# Patient Record
Sex: Female | Born: 1985 | Race: White | Hispanic: No | Smoking: Former smoker
Health system: Southern US, Community
[De-identification: ages and names within clinical notes are randomized; demographics above are authoritative.]

## PROBLEM LIST (undated history)

## (undated) DIAGNOSIS — K219 Gastro-esophageal reflux disease without esophagitis: Secondary | ICD-10-CM

## (undated) DIAGNOSIS — F419 Anxiety disorder, unspecified: Secondary | ICD-10-CM

## (undated) DIAGNOSIS — Z87898 Personal history of other specified conditions: Secondary | ICD-10-CM

## (undated) DIAGNOSIS — G90A Postural orthostatic tachycardia syndrome (POTS): Secondary | ICD-10-CM

## (undated) DIAGNOSIS — E079 Disorder of thyroid, unspecified: Secondary | ICD-10-CM

## (undated) DIAGNOSIS — T7840XA Allergy, unspecified, initial encounter: Secondary | ICD-10-CM

## (undated) DIAGNOSIS — IMO0002 Reserved for concepts with insufficient information to code with codable children: Secondary | ICD-10-CM

## (undated) DIAGNOSIS — Z9889 Other specified postprocedural states: Secondary | ICD-10-CM

## (undated) DIAGNOSIS — I498 Other specified cardiac arrhythmias: Secondary | ICD-10-CM

## (undated) DIAGNOSIS — R112 Nausea with vomiting, unspecified: Secondary | ICD-10-CM

## (undated) HISTORY — DX: Disorder of thyroid, unspecified: E07.9

## (undated) HISTORY — DX: Allergy, unspecified, initial encounter: T78.40XA

## (undated) HISTORY — PX: DENTAL SURGERY: SHX609

## (undated) HISTORY — PX: SPINE SURGERY: SHX786

## (undated) HISTORY — DX: Anxiety disorder, unspecified: F41.9

## (undated) HISTORY — PX: BRAIN SURGERY: SHX531

---

## 2007-05-23 ENCOUNTER — Other Ambulatory Visit: Admission: RE | Admit: 2007-05-23 | Discharge: 2007-05-23 | Payer: Self-pay | Admitting: Obstetrics & Gynecology

## 2007-11-28 ENCOUNTER — Inpatient Hospital Stay (HOSPITAL_COMMUNITY): Admission: AD | Admit: 2007-11-28 | Discharge: 2007-11-29 | Payer: Self-pay | Admitting: Obstetrics and Gynecology

## 2009-01-19 ENCOUNTER — Emergency Department (HOSPITAL_COMMUNITY): Admission: EM | Admit: 2009-01-19 | Discharge: 2009-01-19 | Payer: Self-pay | Admitting: Emergency Medicine

## 2010-04-07 ENCOUNTER — Ambulatory Visit (HOSPITAL_COMMUNITY): Admission: RE | Admit: 2010-04-07 | Discharge: 2010-04-07 | Payer: Self-pay | Admitting: Internal Medicine

## 2010-12-15 NOTE — Discharge Summary (Signed)
Melanie Thomas, Melanie Thomas            ACCOUNT NO.:  [id number]   MEDICAL RECORD NO.:  [id number]          PATIENT TYPE:  INP   LOCATION:  9135                          FACILITY:  [facility]   PHYSICIAN:  Huel Cote, M.D. DATE OF BIRTH:  1986/02/26   DATE OF ADMISSION:  11/28/2007  DATE OF DISCHARGE:  11/29/2007                               DISCHARGE SUMMARY   DISCHARGE DIAGNOSES:  1. Term pregnancy at 39 weeks, delivered.  2. Status post normal spontaneous vaginal delivery, VBAC.   DISCHARGE MEDICATIONS:  Motrin 600 mg p.o. every 6 hours.   DISCHARGE FOLLOWUP:  The patient is to follow up in my office in  approximately 6 weeks for her full postpartum exam.   HOSPITAL COURSE:  The patient is 25 year old G2, P 1-0-0-1 who was  admitted in early labor with spontaneous rupture of membranes at 14  weeks' gestation.  Prenatal care was significant for a prior C-section,  and the patient definitely desired to have a VBAC delivery.  She had  been counseled with all risks and benefits and decided to have a trial  of labor.  Otherwise, her prenatal care was really uneventful.   Her prenatal labs were as follows:  A positive, antibody negative, RPR  nonreactive, rubella immune, hepatitis B surface antigen negative, HIV  negative, GC negative, chlamydia negative, group B strep negative, 1-  hour Glucola 141, and 3-hour Glucola within normal limits.  Her prenatal  course had been uneventful except for the C-section performed for fetal  distress in her prior pregnancy.   PAST OBSTETRICAL HISTORY:  The C-section was in July 2006, 7 pounds 10  ounce infant.   PAST MEDICAL HISTORY:  None.   PAST SURGICAL HISTORY:  C-section only.   PAST GYN HISTORY:  None.   On admission, she was afebrile with stable vital signs.  Fetal heart  rate was reactive.  Cervix was 1-2, 70, and -2 station, and she had  grossly ruptured membranes documented.  She received an epidural after  being placed on Pitocin for  augmentation of labor and did make progress,  progressed into complete dilation by the evening.  She then pushed great  with a normal spontaneous vaginal delivery of a vigorous female infant  over a small first-degree laceration.  Apgars were 9 and 9.  Weight was  7 pounds 7 ounces.  Placenta delivered spontaneously.  Uterine scar was  palpated and intact.  Estimated blood loss was 350 mL.  She had one  figure-of-eight suture placed in her  laceration for hemostasis and was then admitted for routine postpartum  care.  Postpartum day #1, hemoglobin was 11 and she was doing quite  well.  She actually requested early discharge, and this was granted as  the baby was able to be discharged also.  She is to follow up in the  office in 6 weeks for her routine postpartum exam.      Huel Cote, M.D.  Electronically Signed     KR/MEDQ  D:  12/17/2007  T:  12/17/2007  Job:  [id number]

## 2012-05-14 ENCOUNTER — Encounter (HOSPITAL_COMMUNITY): Payer: Self-pay | Admitting: *Deleted

## 2012-05-14 ENCOUNTER — Emergency Department (HOSPITAL_COMMUNITY)
Admission: EM | Admit: 2012-05-14 | Discharge: 2012-05-14 | Disposition: A | Discharge status: Home / Self Care | Payer: Self-pay | Attending: Emergency Medicine | Admitting: Emergency Medicine

## 2012-05-14 DIAGNOSIS — N342 Other urethritis: Secondary | ICD-10-CM | POA: Insufficient documentation

## 2012-05-14 DIAGNOSIS — Z87891 Personal history of nicotine dependence: Secondary | ICD-10-CM | POA: Insufficient documentation

## 2012-05-14 LAB — WET PREP, GENITAL: Yeast Wet Prep HPF POC: NONE SEEN

## 2012-05-14 LAB — PREGNANCY, URINE: Preg Test, Ur: NEGATIVE

## 2012-05-14 LAB — URINALYSIS, ROUTINE W REFLEX MICROSCOPIC
Glucose, UA: NEGATIVE mg/dL
Leukocytes, UA: NEGATIVE
Specific Gravity, Urine: 1.015 (ref 1.005–1.030)
pH: 6.5 (ref 5.0–8.0)

## 2012-05-14 LAB — CBC WITH DIFFERENTIAL/PLATELET
Basophils Relative: 0 % (ref 0–1)
Hemoglobin: 13.7 g/dL (ref 12.0–15.0)
Lymphs Abs: 1.7 10*3/uL (ref 0.7–4.0)
Monocytes Relative: 5 % (ref 3–12)
Neutro Abs: 5.5 10*3/uL (ref 1.7–7.7)
Neutrophils Relative %: 72 % (ref 43–77)
RBC: 4.46 MIL/uL (ref 3.87–5.11)

## 2012-05-14 MED ORDER — PHENAZOPYRIDINE HCL 200 MG PO TABS: 200.0000 mg | ORAL_TABLET | Freq: Three times a day (TID) | ORAL | Status: DC

## 2012-05-14 MED ORDER — SULFAMETHOXAZOLE-TMP DS 800-160 MG PO TABS
1.0000 | ORAL_TABLET | Freq: Once | ORAL | Status: AC
  Administered 2012-05-14: 1 via ORAL
  Filled 2012-05-14: qty 1

## 2012-05-14 MED ORDER — PHENAZOPYRIDINE HCL 100 MG PO TABS
200.0000 mg | ORAL_TABLET | Freq: Once | ORAL | Status: AC
  Administered 2012-05-14: 200 mg via ORAL
  Filled 2012-05-14: qty 2

## 2012-05-14 MED ORDER — SULFAMETHOXAZOLE-TRIMETHOPRIM 800-160 MG PO TABS: 1.0000 | ORAL_TABLET | Freq: Two times a day (BID) | ORAL | Status: DC

## 2012-05-14 NOTE — ED Notes (Signed)
Pt c/o lower abd pain, worse on left side, burning and pain with urination, pt also states that she noticed blood with urination as well.

## 2012-05-14 NOTE — ED Provider Notes (Signed)
History     CSN: [id number]  Arrival date & time 05/14/12  1526   First MD Initiated Contact with Patient 05/14/12 1635      Chief Complaint  Patient presents with  . Hematuria  . Abdominal Pain    HPI Melanie Thomas is a 26 y.o. female who presents to the ED with abdominal pain. She states the pain started one week ago. The pain started as just a discomfort and has gotten progressively worse. She rates her pain as 7/10. Associated symptoms include hematuria, difficulty voiding, urgency, frequency. Started drinking cranberry juice and symptoms improved some. Last period 05/02/12 and was normal.    History reviewed. No pertinent past medical history.  Past Surgical History  Procedure Date  . Cesarean section     No family history on file.  History  Substance Use Topics  . Smoking status: Former Games developer  . Smokeless tobacco: Not on file  . Alcohol Use: No    OB History    Grav Para Term Preterm Abortions TAB SAB Ect Mult Living                  Review of Systems  Constitutional: Negative for fever, chills, diaphoresis and fatigue.  HENT: Negative for ear pain, congestion, sore throat, facial swelling, neck pain, neck stiffness, dental problem and sinus pressure.   Eyes: Negative for photophobia, pain and discharge.  Respiratory: Negative for cough, chest tightness, shortness of breath and wheezing.   Cardiovascular: Negative for chest pain, palpitations and leg swelling.  Gastrointestinal: Positive for nausea and abdominal pain. Negative for vomiting, diarrhea, constipation and abdominal distention.  Genitourinary: Positive for urgency, frequency, hematuria, difficulty urinating and pelvic pain. Negative for dysuria, flank pain, vaginal bleeding and vaginal discharge.  Musculoskeletal: Positive for back pain. Negative for myalgias and gait problem.  Skin: Negative for color change and rash.  Neurological: Negative for dizziness, speech difficulty, weakness,  light-headedness, numbness and headaches.  Psychiatric/Behavioral: Negative for confusion and agitation. The patient is not nervous/anxious.     Allergies  Aspirin; Ibuprofen; Other; and Shrimp  Home Medications  No current outpatient prescriptions on file.  BP 133/81  Pulse 101  Temp 98 F (36.7 C) (Oral)  Resp 20  Ht 5\' 3"  (1.6 m)  Wt 167 lb (75.751 kg)  BMI 29.58 kg/m2  SpO2 100%  LMP 04/30/2012  Physical Exam  Nursing note and vitals reviewed. Constitutional: She is oriented to person, place, and time. She appears well-developed and well-nourished. No distress.  HENT:  Head: Normocephalic and atraumatic.  Eyes: EOM are normal. Pupils are equal, round, and reactive to light.  Neck: Neck supple.  Cardiovascular: Normal rate and regular rhythm.   Pulmonary/Chest: Effort normal. No respiratory distress. She has no wheezes.  Abdominal: Soft. Bowel sounds are normal. There is tenderness in the right lower quadrant, suprapubic area and left lower quadrant. There is no rigidity, no rebound, no guarding and no CVA tenderness.  Genitourinary:       External genitalia without lesions. Mucous discharge vaginal vault. No CMT, mild bilateral adnexal tenderness. Uterus without palpable enlargement.  Musculoskeletal: Normal range of motion. She exhibits no edema.  Neurological: She is alert and oriented to person, place, and time. No cranial nerve deficit.  Skin: Skin is warm and dry.  Psychiatric: She has a normal mood and affect. Her behavior is normal. Judgment and thought content normal.   Assessment: 26 y.o. female with hx of hematuria and urinary frequency, urgency  Urethritis  Plan:  Pyridium   Septra DS   Follow up with Dr. Senaida Ores, return here as needed. Procedures   MDM; I have discussed this case with Dr. Lars Mage.  Discussed with the patient and all questioned fully answered. Patient voices understanding.   Medication List     As of 05/14/2012  6:22 PM    ASK  your doctor about these medications         ALEVE 220 MG tablet   Generic drug: naproxen sodium      fluticasone 50 MCG/ACT nasal spray   Commonly known as: [facility], NP 05/14/12 1822

## 2012-05-14 NOTE — ED Provider Notes (Signed)
Medical screening examination/treatment/procedure(s) were performed by non-physician practitioner and as supervising physician I was immediately available for consultation/collaboration. Devoria Albe, MD, Armando Gang   Ward Givens, MD 05/14/12 [id number]

## 2012-05-15 LAB — GC/CHLAMYDIA PROBE AMP, GENITAL: Chlamydia, DNA Probe: NEGATIVE

## 2012-10-30 NOTE — H&P (Signed)
  NTS SOAP Note  Vital Signs:  Vitals as of: 10/30/2012: Systolic 138: Diastolic 81: Heart Rate 89: Temp 98.79F: Height 51ft 3in: Weight 176Lbs 0 Ounces: Pain Level 5: BMI 31.18  BMI : 31.18 kg/m2  Subjective: This 27 Years 2 Months old Female presents for of   rectal pain.  States she has some rectal burning with bowel movements.  Has been occurring intermittently for some time now.  Made worse with constipation.  Does not strain to move bowels.  Takes a stool softener now.  Occassional bright red blood on toilet paper when she wipes herself.  Pain radiates to coccyx.  Review of Symptoms:  Constitutional:unremarkable   Head:unremarkable    Eyes:unremarkable   Nose/Mouth/Throat:unremarkable Cardiovascular:  unremarkable   Respiratory:unremarkable   Gastrointestinal:  unremarkable   Genitourinary:unremarkable     Musculoskeletal:unremarkable   Skin:unremarkable Hematolgic/Lymphatic:unremarkable     Allergic/Immunologic:unremarkable     Past Medical History:    Reviewed   Past Medical History  Surgical History: c-section Medical Problems: none Allergies: red dye, excedrin Medications: allegra, pepcid   Social History:Reviewed  Social History  Preferred Language: English Race:  White Ethnicity: Not Hispanic / Latino Age: 27 Years 2 Months Marital Status:  M Alcohol:  No Recreational drug(s):  No   Smoking Status: Never smoker reviewed on 10/16/2012 Functional Status reviewed on mm/dd/yyyy ------------------------------------------------ Bathing: Normal Cooking: Normal Dressing: Normal Driving: Normal Eating: Normal Managing Meds: Normal Oral Care: Normal Shopping: Normal Toileting: Normal Transferring: Normal Walking: Normal Cognitive Status reviewed on mm/dd/yyyy ------------------------------------------------ Attention: Normal Decision Making: Normal Language: Normal Memory: Normal Motor: Normal Perception:  Normal Problem Solving: Normal Visual and Spatial: Normal   Family History:  Reviewed   Family History  Is there a family history [id number]    Objective Information: General:  Well appearing, well nourished in no distress. Heart:  RRR, no murmur Lungs:    CTA bilaterally, no wheezes, rhonchi, rales.  Breathing unlabored. Abdomen:Soft, NT/ND, no HSM, no masses.     Small hemorrhoids alng right side, small posterior anal fissure.  Assessment:Anal fissure  Diagnosis &amp; Procedure Smart Code   Plan:  Scheduled for anal fissurectomy, possible hemorrhoidectomy on 11/21/12.  Risks and benefits of procedure including recurrence of the disease were fully explained to the patient, who gives informed consent.   Follow-up:Weeks 3

## 2012-11-06 ENCOUNTER — Encounter (HOSPITAL_COMMUNITY): Payer: Self-pay | Admitting: Pharmacy Technician

## 2012-11-12 NOTE — Patient Instructions (Addendum)
ROYALTI SCHAUF  11/12/2012   Your procedure is scheduled on:   11/21/2012  Report to [facility] at  730  AM.  Call this number if you have problems the morning of surgery: [phone]   Remember:   Do not eat food or drink liquids after midnight.   Take these medicines the morning of surgery with A SIP OF WATER:  pepcid,allegra   Do not wear jewelry, make-up or nail polish.  Do not wear lotions, powders, or perfumes.   Do not shave 48 hours prior to surgery. Men may shave face and neck.  Do not bring valuables to the hospital.  Contacts, dentures or bridgework may not be worn into surgery.  Leave suitcase in the car. After surgery it may be brought to your room.  For patients admitted to the hospital, checkout time is 11:00 AM the day of discharge.   Patients discharged the day of surgery will not be allowed to drive  home.  Name and phone number of your driver: family  Special Instructions: Shower using CHG 2 nights before surgery and the night before surgery.  If you shower the day of surgery use CHG.  Use special wash - you have one bottle of CHG for all showers.  You should use approximately 1/3 of the bottle for each shower.   Please read over the following fact sheets that you were given: Pain Booklet, Coughing and Deep Breathing, MRSA Information, Surgical Site Infection Prevention, Anesthesia Post-op Instructions and Care and Recovery After Surgery Anal Fissure, Adult An anal fissure is a small tear or crack in the skin around the opening of the butt (anus).Bleeding from the tear or crack usually stops on its own within a few minutes. The bleeding may happen every time you poop until the tear or crack heals. HOME CARE  Eat lots of fruit, whole grains, and vegetables. Avoid foods like bananas and dairy products. These foods can make it hard to poop.  Take a warm water bath (sitz bath) as told by your doctor.  Drink enough fluids to keep your pee (urine) clear or pale  yellow.  Only take medicines as told by your doctor. Do not take aspirin.  Do not use numbing creams or hydrocortisone cream on the area. These creams can slow healing. GET HELP RIGHT AWAY IF:  Your tear or crack is not healed in 3 days.  You have more bleeding.  You have a fever.  You have watery poop (diarrhea) mixed with blood.  You have pain.  You are getting worse, not better. MAKE SURE YOU:   Understand these instructions.  Will watch your condition.  Will get help right away if you are not doing well or get worse. Document Released: 03/14/2011 Document Revised: 10/08/2011 Document Reviewed: 03/14/2011 [facility] Patient Information 2013 [place], [place]. PATIENT INSTRUCTIONS POST-ANESTHESIA  IMMEDIATELY FOLLOWING SURGERY:  Do not drive or operate machinery for the first twenty four hours after surgery.  Do not make any important decisions for twenty four hours after surgery or while taking narcotic pain medications or sedatives.  If you develop intractable nausea and vomiting or a severe headache please notify your doctor immediately.  FOLLOW-UP:  Please make an appointment with your surgeon as instructed. You do not need to follow up with anesthesia unless specifically instructed to do so.  WOUND CARE INSTRUCTIONS (if applicable):  Keep a dry clean dressing on the anesthesia/puncture wound site if there is drainage.  Once the wound  has quit draining you may leave it open to air.  Generally you should leave the bandage intact for twenty four hours unless there is drainage.  If the epidural site drains for more than 36-48 hours please call the anesthesia department.  QUESTIONS?:  Please feel free to call your physician or the hospital operator if you have any questions, and they will be happy to assist you.

## 2012-11-13 ENCOUNTER — Encounter (HOSPITAL_COMMUNITY)
Admission: RE | Admit: 2012-11-13 | Discharge: 2012-11-13 | Disposition: A | Discharge status: Home / Self Care | Payer: BC Managed Care – PPO | Source: Ambulatory Visit | Attending: General Surgery | Admitting: General Surgery

## 2012-11-13 ENCOUNTER — Encounter (HOSPITAL_COMMUNITY): Payer: Self-pay

## 2012-11-13 HISTORY — DX: Gastro-esophageal reflux disease without esophagitis: K21.9

## 2012-11-13 HISTORY — DX: Personal history of other specified conditions: Z87.898

## 2012-11-13 LAB — BASIC METABOLIC PANEL
BUN: 12 mg/dL (ref 6–23)
Chloride: 100 mEq/L (ref 96–112)
Creatinine, Ser: 0.82 mg/dL (ref 0.50–1.10)
GFR calc Af Amer: >90 mL/min (ref >90)
Glucose, Bld: 83 mg/dL (ref 70–99)

## 2012-11-13 LAB — CBC WITH DIFFERENTIAL/PLATELET
Basophils Absolute: 0 10*3/uL (ref 0.0–0.1)
Basophils Relative: 0 % (ref 0–1)
Eosinophils Absolute: 0.2 10*3/uL (ref 0.0–0.7)
Eosinophils Relative: 3 % (ref 0–5)
HCT: 40.3 % (ref 36.0–46.0)
MCH: 29.9 pg (ref 26.0–34.0)
MCHC: 34.2 g/dL (ref 30.0–36.0)
MCV: 87.2 fL (ref 78.0–100.0)
Monocytes Absolute: 0.4 10*3/uL (ref 0.1–1.0)
Neutro Abs: 4.2 10*3/uL (ref 1.7–7.7)
RDW: 12.6 % (ref 11.5–15.5)

## 2012-11-13 MED ORDER — CHLORHEXIDINE GLUCONATE 4 % EX LIQD: 1.0000 "application " | Freq: Once | CUTANEOUS | Status: DC

## 2012-11-13 MED ORDER — ONDANSETRON HCL 4 MG/2ML IJ SOLN
INTRAMUSCULAR | Status: AC
  Filled 2012-11-13: qty 2

## 2012-11-21 ENCOUNTER — Encounter (HOSPITAL_COMMUNITY): Payer: Self-pay | Admitting: Anesthesiology

## 2012-11-21 ENCOUNTER — Ambulatory Visit (HOSPITAL_COMMUNITY)
Admission: RE | Admit: 2012-11-21 | Discharge: 2012-11-21 | Disposition: A | Discharge status: Home / Self Care | Payer: BC Managed Care – PPO | Source: Ambulatory Visit | Attending: General Surgery | Admitting: General Surgery

## 2012-11-21 ENCOUNTER — Encounter (HOSPITAL_COMMUNITY)
Admission: RE | Disposition: A | Discharge status: Home / Self Care | Payer: Self-pay | Source: Ambulatory Visit | Attending: General Surgery

## 2012-11-21 ENCOUNTER — Ambulatory Visit (HOSPITAL_COMMUNITY): Payer: BC Managed Care – PPO | Admitting: Anesthesiology

## 2012-11-21 ENCOUNTER — Encounter (HOSPITAL_COMMUNITY): Payer: Self-pay | Admitting: *Deleted

## 2012-11-21 DIAGNOSIS — K602 Anal fissure, unspecified: Secondary | ICD-10-CM | POA: Insufficient documentation

## 2012-11-21 HISTORY — DX: Nausea with vomiting, unspecified: Z98.890

## 2012-11-21 HISTORY — PX: ANAL FISSURE REPAIR: SHX2312

## 2012-11-21 HISTORY — DX: Nausea with vomiting, unspecified: R11.2

## 2012-11-21 SURGERY — REPAIR, FISSURE, ANAL
Anesthesia: General | Site: Rectum | Wound class: Dirty or Infected

## 2012-11-21 MED ORDER — GLYCOPYRROLATE 0.2 MG/ML IJ SOLN
INTRAMUSCULAR | Status: DC | PRN
  Administered 2012-11-21: 0.4 mg via INTRAVENOUS

## 2012-11-21 MED ORDER — MIDAZOLAM HCL 2 MG/2ML IJ SOLN
INTRAMUSCULAR | Status: AC
  Filled 2012-11-21: qty 2

## 2012-11-21 MED ORDER — SODIUM CHLORIDE 0.9 % IV SOLN
INTRAVENOUS | Status: AC
  Filled 2012-11-21: qty 1

## 2012-11-21 MED ORDER — DEXAMETHASONE SODIUM PHOSPHATE 4 MG/ML IJ SOLN
INTRAMUSCULAR | Status: AC
  Filled 2012-11-21: qty 1

## 2012-11-21 MED ORDER — LIDOCAINE HCL 1 % IJ SOLN
INTRAMUSCULAR | Status: DC | PRN
  Administered 2012-11-21: 50 mg via INTRADERMAL

## 2012-11-21 MED ORDER — ROCURONIUM BROMIDE 50 MG/5ML IV SOLN
INTRAVENOUS | Status: AC
  Filled 2012-11-21: qty 1

## 2012-11-21 MED ORDER — BUPIVACAINE HCL (PF) 0.5 % IJ SOLN
INTRAMUSCULAR | Status: AC
  Filled 2012-11-21: qty 30

## 2012-11-21 MED ORDER — DEXAMETHASONE SODIUM PHOSPHATE 4 MG/ML IJ SOLN
4.0000 mg | Freq: Once | INTRAMUSCULAR | Status: AC
  Administered 2012-11-21: 4 mg via INTRAVENOUS

## 2012-11-21 MED ORDER — LACTATED RINGERS IV SOLN
INTRAVENOUS | Status: DC
  Administered 2012-11-21: 1000 mL via INTRAVENOUS
  Administered 2012-11-21: 08:00:00 via INTRAVENOUS

## 2012-11-21 MED ORDER — PROPOFOL 10 MG/ML IV BOLUS
INTRAVENOUS | Status: DC | PRN
  Administered 2012-11-21: 160 mg via INTRAVENOUS

## 2012-11-21 MED ORDER — BUPIVACAINE HCL (PF) 0.5 % IJ SOLN
INTRAMUSCULAR | Status: DC | PRN
  Administered 2012-11-21: 7 mL

## 2012-11-21 MED ORDER — PROPOFOL 10 MG/ML IV EMUL
INTRAVENOUS | Status: AC
  Filled 2012-11-21: qty 20

## 2012-11-21 MED ORDER — FENTANYL CITRATE 0.05 MG/ML IJ SOLN
INTRAMUSCULAR | Status: AC
  Filled 2012-11-21: qty 5

## 2012-11-21 MED ORDER — ONDANSETRON HCL 4 MG/2ML IJ SOLN
INTRAMUSCULAR | Status: AC
  Filled 2012-11-21: qty 2

## 2012-11-21 MED ORDER — GLYCOPYRROLATE 0.2 MG/ML IJ SOLN
INTRAMUSCULAR | Status: AC
  Filled 2012-11-21: qty 2

## 2012-11-21 MED ORDER — LIDOCAINE HCL (PF) 1 % IJ SOLN
INTRAMUSCULAR | Status: AC
  Filled 2012-11-21: qty 5

## 2012-11-21 MED ORDER — HEMOSTATIC AGENTS (NO CHARGE) OPTIME
TOPICAL | Status: DC | PRN
  Administered 2012-11-21: 1

## 2012-11-21 MED ORDER — ERTAPENEM SODIUM 1 G IJ SOLR
1.0000 g | INTRAMUSCULAR | Status: AC
  Administered 2012-11-21: 1 g via INTRAVENOUS

## 2012-11-21 MED ORDER — FENTANYL CITRATE 0.05 MG/ML IJ SOLN
25.0000 ug | INTRAMUSCULAR | Status: DC | PRN
  Administered 2012-11-21 (×2): 25 ug via INTRAVENOUS

## 2012-11-21 MED ORDER — PROMETHAZINE HCL 50 MG PO TABS: 25.0000 mg | ORAL_TABLET | Freq: Four times a day (QID) | ORAL | Status: DC | PRN

## 2012-11-21 MED ORDER — NEOSTIGMINE METHYLSULFATE 1 MG/ML IJ SOLN
INTRAMUSCULAR | Status: DC | PRN
  Administered 2012-11-21: 3 mg via INTRAVENOUS

## 2012-11-21 MED ORDER — LIDOCAINE VISCOUS 2 % MT SOLN
OROMUCOSAL | Status: DC | PRN
  Administered 2012-11-21: 1 via OROMUCOSAL

## 2012-11-21 MED ORDER — DOCUSATE SODIUM 100 MG PO CAPS: 100.0000 mg | ORAL_CAPSULE | Freq: Two times a day (BID) | ORAL | Status: DC

## 2012-11-21 MED ORDER — OXYCODONE HCL 5 MG PO TABS: 5.0000 mg | ORAL_TABLET | ORAL | Status: DC | PRN

## 2012-11-21 MED ORDER — ROCURONIUM BROMIDE 100 MG/10ML IV SOLN
INTRAVENOUS | Status: DC | PRN
  Administered 2012-11-21: 30 mg via INTRAVENOUS

## 2012-11-21 MED ORDER — FENTANYL CITRATE 0.05 MG/ML IJ SOLN
INTRAMUSCULAR | Status: AC
  Filled 2012-11-21: qty 2

## 2012-11-21 MED ORDER — MIDAZOLAM HCL 2 MG/2ML IJ SOLN
1.0000 mg | INTRAMUSCULAR | Status: DC | PRN
  Administered 2012-11-21 (×2): 2 mg via INTRAVENOUS

## 2012-11-21 MED ORDER — LIDOCAINE VISCOUS 2 % MT SOLN
OROMUCOSAL | Status: AC
  Filled 2012-11-21: qty 15

## 2012-11-21 MED ORDER — FENTANYL CITRATE 0.05 MG/ML IJ SOLN
INTRAMUSCULAR | Status: DC | PRN
  Administered 2012-11-21 (×3): 50 ug via INTRAVENOUS
  Administered 2012-11-21: 100 ug via INTRAVENOUS

## 2012-11-21 MED ORDER — ONDANSETRON HCL 4 MG/2ML IJ SOLN
4.0000 mg | Freq: Once | INTRAMUSCULAR | Status: AC
  Administered 2012-11-21: 4 mg via INTRAVENOUS

## 2012-11-21 MED ORDER — MIDAZOLAM HCL 5 MG/5ML IJ SOLN
INTRAMUSCULAR | Status: DC | PRN
  Administered 2012-11-21: 2 mg via INTRAVENOUS

## 2012-11-21 MED ORDER — ONDANSETRON HCL 4 MG/2ML IJ SOLN
4.0000 mg | Freq: Once | INTRAMUSCULAR | Status: AC | PRN
  Administered 2012-11-21: 4 mg via INTRAVENOUS

## 2012-11-21 SURGICAL SUPPLY — 30 items
BAG HAMPER (MISCELLANEOUS) ×2 IMPLANT
CLOTH BEACON ORANGE TIMEOUT ST (SAFETY) ×2 IMPLANT
COVER LIGHT HANDLE STERIS (MISCELLANEOUS) ×4 IMPLANT
DECANTER SPIKE VIAL GLASS SM (MISCELLANEOUS) ×2 IMPLANT
DRAPE PROXIMA HALF (DRAPES) ×2 IMPLANT
ELECT REM PT RETURN 9FT ADLT (ELECTROSURGICAL) ×2
ELECTRODE REM PT RTRN 9FT ADLT (ELECTROSURGICAL) ×1 IMPLANT
FORMALIN 10 PREFIL 480ML (MISCELLANEOUS) IMPLANT
GLOVE BIO SURGEON STRL SZ7.5 (GLOVE) IMPLANT
GLOVE BIOGEL PI IND STRL 7.0 (GLOVE) ×1 IMPLANT
GLOVE BIOGEL PI IND STRL 7.5 (GLOVE) ×1 IMPLANT
GLOVE BIOGEL PI INDICATOR 7.0 (GLOVE) ×1
GLOVE BIOGEL PI INDICATOR 7.5 (GLOVE) ×1
GLOVE SS N UNI LF 6.5 STRL (GLOVE) ×2 IMPLANT
GLOVE SS N UNI LF 7.5 STRL (GLOVE) ×2 IMPLANT
GOWN STRL REIN XL XLG (GOWN DISPOSABLE) ×4 IMPLANT
HEMOSTAT SURGICEL 4X8 (HEMOSTASIS) ×2 IMPLANT
KIT ROOM TURNOVER APOR (KITS) ×2 IMPLANT
LUBRICANT JELLY 4.5OZ STERILE (MISCELLANEOUS) ×2 IMPLANT
MANIFOLD NEPTUNE II (INSTRUMENTS) ×2 IMPLANT
NEEDLE HYPO 25X1 1.5 SAFETY (NEEDLE) ×2 IMPLANT
NS IRRIG 1000ML POUR BTL (IV SOLUTION) ×2 IMPLANT
PACK PERI GYN (CUSTOM PROCEDURE TRAY) ×2 IMPLANT
PAD ARMBOARD 7.5X6 YLW CONV (MISCELLANEOUS) ×2 IMPLANT
SET BASIN LINEN APH (SET/KITS/TRAYS/PACK) ×2 IMPLANT
SPONGE GAUZE 4X4 12PLY (GAUZE/BANDAGES/DRESSINGS) ×2 IMPLANT
SUT SILK 0 FSL (SUTURE) ×2 IMPLANT
SUT VIC AB 2-0 CT2 27 (SUTURE) ×4 IMPLANT
SYR CONTROL 10ML LL (SYRINGE) ×2 IMPLANT
TUBE ANAEROBIC PORT A CUL  W/M (MISCELLANEOUS) IMPLANT

## 2012-11-21 NOTE — Anesthesia Preprocedure Evaluation (Signed)
Anesthesia Evaluation  Patient identified by MRN, date of birth, ID band Patient awake    Reviewed: Allergy & Precautions, H&P , NPO status , Patient's Chart, lab work & pertinent test results  History of Anesthesia Complications (+) PONV  Airway Mallampati: II TM Distance: >3 FB     Dental  (+) Teeth Intact   Pulmonary neg pulmonary ROS,  breath sounds clear to auscultation        Cardiovascular negative cardio ROS  Rhythm:Regular Rate:Normal     Neuro/Psych    GI/Hepatic GERD-  Medicated and Controlled,  Endo/Other    Renal/GU      Musculoskeletal   Abdominal   Peds  Hematology   Anesthesia Other Findings   Reproductive/Obstetrics                           Anesthesia Physical Anesthesia Plan  ASA: II  Anesthesia Plan: General   Post-op Pain Management:    Induction: Intravenous, Rapid sequence and Cricoid pressure planned  Airway Management Planned: Oral ETT  Additional Equipment:   Intra-op Plan:   Post-operative Plan: Extubation in OR  Informed Consent: I have reviewed the patients History and Physical, chart, labs and discussed the procedure including the risks, benefits and alternatives for the proposed anesthesia with the patient or authorized representative who has indicated his/her understanding and acceptance.     Plan Discussed with:   Anesthesia Plan Comments:         Anesthesia Quick Evaluation

## 2012-11-21 NOTE — Brief Op Note (Signed)
Pt had a bruise on her left flank and right buttock  She states she fell at home  Easter Sunday fell down the steps

## 2012-11-21 NOTE — Interval H&P Note (Signed)
History and Physical Interval Note:  11/21/2012 7:25 AM  Melanie Thomas  has presented today for surgery, with the diagnosis of anal fissure  The various methods of treatment have been discussed with the patient and family. After consideration of risks, benefits and other options for treatment, the patient has consented to  Procedure(s): ANAL FISSURECTOMY (N/A) as a surgical intervention .  The patient's history has been reviewed, patient examined, no change in status, stable for surgery.  I have reviewed the patient's chart and labs.  Questions were answered to the patient's satisfaction.     Franky Macho A

## 2012-11-21 NOTE — Anesthesia Postprocedure Evaluation (Signed)
  Anesthesia Post-op Note  Patient: Melanie Thomas  Procedure(s) Performed: Procedure(s): ANAL FISSURECTOMY (N/A)  Patient Location: PACU  Anesthesia Type:General  Level of Consciousness: awake, alert , oriented and patient cooperative  Airway and Oxygen Therapy: Patient Spontanous Breathing  Post-op Pain: 3 /10, mild  Post-op Assessment: Post-op Vital signs reviewed, Patient's Cardiovascular Status Stable, Respiratory Function Stable, Patent Airway, No signs of Nausea or vomiting and Pain level controlled  Post-op Vital Signs: Reviewed and stable  Complications: No apparent anesthesia complications

## 2012-11-21 NOTE — Transfer of Care (Signed)
Immediate Anesthesia Transfer of Care Note  Patient: Melanie Thomas  Procedure(s) Performed: Procedure(s): ANAL FISSURECTOMY (N/A)  Patient Location: PACU  Anesthesia Type:General  Level of Consciousness: awake and patient cooperative  Airway & Oxygen Therapy: Patient Spontanous Breathing and Patient connected to face mask oxygen  Post-op Assessment: Report given to PACU RN, Post -op Vital signs reviewed and stable and Patient moving all extremities  Post vital signs: Reviewed and stable  Complications: No apparent anesthesia complications

## 2012-11-21 NOTE — Anesthesia Procedure Notes (Signed)
Procedure Name: Intubation Date/Time: 11/21/2012 7:43 AM Performed by: Despina Hidden Pre-anesthesia Checklist: Emergency Drugs available, Patient identified, Suction available and Patient being monitored Patient Re-evaluated:Patient Re-evaluated prior to inductionOxygen Delivery Method: Circle system utilized Preoxygenation: Pre-oxygenation with 100% oxygen Intubation Type: IV induction and Cricoid Pressure applied Ventilation: Mask ventilation without difficulty Laryngoscope Size: Mac and 3 Grade View: Grade I Tube type: Oral Tube size: 7.0 mm Number of attempts: 1 Airway Equipment and Method: Stylet Placement Confirmation: ETT inserted through vocal cords under direct vision,  positive ETCO2 and breath sounds checked- equal and bilateral Secured at: 22 cm Tube secured with: Tape Dental Injury: Teeth and Oropharynx as per pre-operative assessment

## 2012-11-21 NOTE — Op Note (Signed)
Patient:  Melanie Thomas  DOB:  [birth year]  MRN:  [id number]   Preop Diagnosis:  Chronic anal fissure  Postop Diagnosis:  Same  Procedure:  Anal fissurectomy  Surgeon:  Franky Macho, M.D.  Anes:  General endotracheal  Indications:  Patient is a 27 year old white female who presents with a chronic posterior anal fissure. The risks and benefits of the procedure including bleeding, infection, and recurrence of the fissure were fully explained to the patient, gave informed consent.  Procedure note:  The patient was placed in the lithotomy position after induction of general endotracheal anesthesia. The perineum was prepped and draped using usual sterile technique with Hibiclens. Surgical site confirmation was performed.  On rectal examination, a posterior anal fissure was noted with a sentinel tag. She also had an external hemorrhoidal skin tag anteriorly. Both the skin tags were removed using the LigaSure. The anus allowed to fingers to be present, thus no sphincterotomy was performed. The fissure was excised and the mucosal edges reapproximated using 2-0 Vicryl interrupted sutures. 0.5% Sensorcaine was instilled the surrounding peritoneum. Surgicel and Viscous Xylocaine rectal packing was then placed.  All tape and needle counts were correct at the end of the procedure. The patient was extubated in the operating room and transferred to PACU in stable condition.  Complications:  None  EBL:  Minimal  Specimen:  None

## 2012-11-25 ENCOUNTER — Encounter (HOSPITAL_COMMUNITY): Payer: Self-pay | Admitting: General Surgery

## 2013-02-05 ENCOUNTER — Other Ambulatory Visit: Payer: Self-pay

## 2013-02-05 ENCOUNTER — Emergency Department (HOSPITAL_COMMUNITY): Payer: BC Managed Care – PPO

## 2013-02-05 ENCOUNTER — Emergency Department (HOSPITAL_COMMUNITY)
Admission: EM | Admit: 2013-02-05 | Discharge: 2013-02-05 | Disposition: A | Discharge status: Home / Self Care | Payer: BC Managed Care – PPO | Attending: Emergency Medicine | Admitting: Emergency Medicine

## 2013-02-05 ENCOUNTER — Encounter (HOSPITAL_COMMUNITY): Payer: Self-pay | Admitting: Emergency Medicine

## 2013-02-05 DIAGNOSIS — R209 Unspecified disturbances of skin sensation: Secondary | ICD-10-CM | POA: Insufficient documentation

## 2013-02-05 DIAGNOSIS — R5381 Other malaise: Secondary | ICD-10-CM | POA: Insufficient documentation

## 2013-02-05 DIAGNOSIS — Z87891 Personal history of nicotine dependence: Secondary | ICD-10-CM | POA: Insufficient documentation

## 2013-02-05 DIAGNOSIS — Z91040 Latex allergy status: Secondary | ICD-10-CM | POA: Insufficient documentation

## 2013-02-05 DIAGNOSIS — Z87898 Personal history of other specified conditions: Secondary | ICD-10-CM | POA: Insufficient documentation

## 2013-02-05 DIAGNOSIS — Z8719 Personal history of other diseases of the digestive system: Secondary | ICD-10-CM | POA: Insufficient documentation

## 2013-02-05 DIAGNOSIS — M79602 Pain in left arm: Secondary | ICD-10-CM

## 2013-02-05 DIAGNOSIS — M79609 Pain in unspecified limb: Secondary | ICD-10-CM | POA: Insufficient documentation

## 2013-02-05 DIAGNOSIS — R5383 Other fatigue: Secondary | ICD-10-CM | POA: Insufficient documentation

## 2013-02-05 LAB — BASIC METABOLIC PANEL
BUN: 12 mg/dL (ref 6–23)
CO2: 28 mEq/L (ref 19–32)
Calcium: 9.8 mg/dL (ref 8.4–10.5)
Creatinine, Ser: 0.71 mg/dL (ref 0.50–1.10)
GFR calc non Af Amer: >90 mL/min (ref >90)
Glucose, Bld: 96 mg/dL (ref 70–99)

## 2013-02-05 LAB — CBC WITH DIFFERENTIAL/PLATELET
Eosinophils Absolute: 0.2 10*3/uL (ref 0.0–0.7)
Eosinophils Relative: 2 % (ref 0–5)
Hemoglobin: 13.8 g/dL (ref 12.0–15.0)
Lymphocytes Relative: 19 % (ref 12–46)
Lymphs Abs: 1.8 10*3/uL (ref 0.7–4.0)
MCHC: 34.4 g/dL (ref 30.0–36.0)
Monocytes Absolute: 0.5 10*3/uL (ref 0.1–1.0)
Monocytes Relative: 5 % (ref 3–12)
Platelets: 275 10*3/uL (ref 150–400)
RDW: 12.4 % (ref 11.5–15.5)

## 2013-02-05 LAB — D-DIMER, QUANTITATIVE: D-Dimer, Quant: <0.27 ug/mL-FEU (ref 0.00–0.48)

## 2013-02-05 MED ORDER — PREDNISONE 20 MG PO TABS: 20.0000 mg | ORAL_TABLET | Freq: Two times a day (BID) | ORAL | Status: DC

## 2013-02-05 NOTE — ED Notes (Signed)
Pt c/o intermittent left arm numbness x 2 days with sharp cp today.

## 2013-02-05 NOTE — ED Provider Notes (Signed)
History  This chart was scribed for Flint Melter, MD by Bennett Scrape, ED Scribe. This patient was seen in room APA09/[facility]09 and the patient's care was started at 3:53 PM.  CSN: [id number]  Arrival date & time 02/05/13  1449   First MD Initiated Contact with Patient 02/05/13 1553     Chief Complaint  Patient presents with  . Chest Pain  . Numbness   Patient is a 27 y.o. female presenting with chest pain. The history is provided by the patient. No language interpreter was used.  Chest Pain Pain location:  L chest Pain quality: shooting   Pain radiates to:  L arm Pain radiates to the back: no   Onset quality:  Gradual Timing:  Intermittent Progression:  Improving Chronicity:  New Relieved by:  Nothing Worsened by:  Deep breathing Associated symptoms: numbness and weakness   Associated symptoms: no cough, no fever, no nausea and not vomiting   Risk factors: no birth control     HPI Comments: Melanie Thomas is a 27 y.o. female who presents to the Emergency Department complaining of 2 days of left arm numbness with associated weakness and CP described as shooting that radiates down her left arm. She denies that the symptoms are worsened with movement of the neck and left arm but reports that she notices the pain occurs more frequently when she is at rest. She rates her pain a 5.5 out of 10 currently and a 9 out of 10 at its worse. Pt denies having prior episodes of similar symptoms. She denies nausea, fever and cough as associated symptoms. She denies being on oral contraceptives currently. She has a h/o GERD and is a former smoker.  PCP is Dr. Phillips Odor  Past Medical History  Diagnosis Date  . GERD (gastroesophageal reflux disease)   . History of angioedema   . PONV (postoperative nausea and vomiting)    Past Surgical History  Procedure Laterality Date  . Cesarean section  2006    [facility]  . Dental surgery  2004, 2009  . Anal fissure repair N/A 11/21/2012   Procedure: ANAL FISSURECTOMY;  Surgeon: Dalia Heading, MD;  Location: [facility];  Service: General;  Laterality: N/A;   Family History  Problem Relation Age of Onset  . Hypothyroidism Mother   . Hypothyroidism Other   . Hypertension Other   . Diabetes Other   . Breast cancer Other   . Heart disease Other   . Hypertension Other   . Diabetes Other   . Hypertension Other   . Diabetes Other   . Breast cancer Other   . Hypertension Other   . Diabetes Other    History  Substance Use Topics  . Smoking status: Former Smoker -- 0.50 packs/day for 2 years    Types: Cigarettes  . Smokeless tobacco: Not on file  . Alcohol Use: Yes     Comment: rare use   No OB history provided.  Review of Systems  Constitutional: Negative for fever.  Respiratory: Negative for cough.   Cardiovascular: Positive for chest pain.  Gastrointestinal: Negative for nausea and vomiting.  Neurological: Positive for weakness and numbness.  All other systems reviewed and are negative.    Allergies  Aspirin; Shrimp; Excedrin tension headache; Ibuprofen; Other; and Latex  Home Medications   Current Outpatient Rx  Name  Route  Sig  Dispense  Refill  . fexofenadine (ALLEGRA) 180 MG tablet   Oral   Take 180 mg by mouth  2 (two) times daily.         Marland Kitchen EPINEPHrine (EPIPEN JR) 0.15 MG/0.3ML injection   Intramuscular   Inject 0.15 mg into the muscle as needed for anaphylaxis.         . predniSONE (DELTASONE) 20 MG tablet   Oral   Take 1 tablet (20 mg total) by mouth 2 (two) times daily.   10 tablet   0    Triage Vitals: BP 199/93  Pulse 118  Temp(Src) 97.7 F (36.5 C) (Oral)  Resp 20  Ht 5\' 3"  (1.6 m)  Wt 181 lb (82.101 kg)  BMI 32.07 kg/m2  SpO2 100%  LMP 01/05/2013  Physical Exam  Nursing note and vitals reviewed. Constitutional: She is oriented to person, place, and time. She appears well-developed and well-nourished.  HENT:  Head: Normocephalic and atraumatic.  Eyes: Conjunctivae and  EOM are normal. Pupils are equal, round, and reactive to light.  Neck: Normal range of motion and phonation normal. Neck supple.  Cardiovascular: Normal rate, regular rhythm and intact distal pulses.   Pulmonary/Chest: Effort normal and breath sounds normal. She exhibits no tenderness.  Abdominal: Soft. She exhibits no distension. There is no tenderness. There is no guarding.  Musculoskeletal: Normal range of motion.  Passive ROM on the left shoulder causes pain with flexion and external rotation  Neurological: She is alert and oriented to person, place, and time. She has normal strength. She exhibits normal muscle tone.  Skin: Skin is warm and dry.  Psychiatric: She has a normal mood and affect. Her behavior is normal. Judgment and thought content normal.    ED Course  Procedures (including critical care time)  DIAGNOSTIC STUDIES: Oxygen Saturation is 100% on room air, normal by my interpretation.    COORDINATION OF CARE: 3:59 PM-Informed pt that her CXR is normal. Discussed treatment plan which includes CBC panel, BMP and troponin with pt at bedside and pt agreed to plan.   6:10 PM-Informed pt of radiology and lab work results. Discussed discharge plan which includes prednisone and heat on affected areas with pt and pt agreed to plan. Also advised pt to follow up as needed and pt agreed. Addressed symptoms to return for with pt.    Date: 02/05/2013  Rate: 106  Rhythm: sinus tachycardia  QRS Axis: normal  Intervals: normal  ST/T Wave abnormalities: normal  Conduction Disutrbances:none  Narrative Interpretation:   Old EKG Reviewed: none available   Labs Reviewed  CBC WITH DIFFERENTIAL  BASIC METABOLIC PANEL  TROPONIN I  D-DIMER, QUANTITATIVE   Dg Chest 2 View  02/05/2013   *RADIOLOGY REPORT*  Clinical Data: Sudden onset chest pain.  CHEST - 2 VIEW  Comparison: 04/07/2010.  Findings: No significant osseous abnormality.  Lungs are clear. No effusion or pneumothorax.   Cardiomediastinal size and contour are within normal limits.  The upper abdomen is unremarkable.  IMPRESSION: No evidence of acute cardiopulmonary disease.   Original Report Authenticated By: Tiburcio Pea   1. Arm pain, left     MDM  Nonspecific left arm pain. Most likely musculoskeletal in origin. Doubt cervical radiculopathy, chest source, acute arthritis or fracture. She is stable for discharge with symptomatic treatment. She is intolerant of ibuprofen because it causes a rash, and, so we'll be substituted.   Plan: Home Medications- prednisone; Home Treatments- use heat on sore area; return here if the recommended treatment, does not improve the symptoms; Recommended follow up- with PCP next week   Flint Melter, MD 02/05/13 2023

## 2013-11-02 ENCOUNTER — Other Ambulatory Visit (HOSPITAL_COMMUNITY): Payer: Self-pay | Admitting: Family Medicine

## 2013-11-02 DIAGNOSIS — R29898 Other symptoms and signs involving the musculoskeletal system: Secondary | ICD-10-CM

## 2013-11-02 DIAGNOSIS — G629 Polyneuropathy, unspecified: Secondary | ICD-10-CM

## 2013-11-02 DIAGNOSIS — M542 Cervicalgia: Secondary | ICD-10-CM

## 2013-11-02 DIAGNOSIS — G8929 Other chronic pain: Secondary | ICD-10-CM

## 2013-11-04 ENCOUNTER — Ambulatory Visit (HOSPITAL_COMMUNITY)
Admission: RE | Admit: 2013-11-04 | Discharge: 2013-11-04 | Disposition: A | Discharge status: Home / Self Care | Payer: BC Managed Care – PPO | Source: Ambulatory Visit | Attending: Family Medicine | Admitting: Family Medicine

## 2013-11-04 DIAGNOSIS — G629 Polyneuropathy, unspecified: Secondary | ICD-10-CM

## 2013-11-04 DIAGNOSIS — G935 Compression of brain: Secondary | ICD-10-CM | POA: Insufficient documentation

## 2013-11-04 DIAGNOSIS — M4802 Spinal stenosis, cervical region: Secondary | ICD-10-CM | POA: Insufficient documentation

## 2013-11-04 DIAGNOSIS — R29898 Other symptoms and signs involving the musculoskeletal system: Secondary | ICD-10-CM

## 2013-11-04 DIAGNOSIS — G8929 Other chronic pain: Secondary | ICD-10-CM

## 2013-11-04 DIAGNOSIS — M5020 Other cervical disc displacement, unspecified cervical region: Secondary | ICD-10-CM | POA: Insufficient documentation

## 2013-11-04 DIAGNOSIS — M5380 Other specified dorsopathies, site unspecified: Secondary | ICD-10-CM | POA: Insufficient documentation

## 2013-11-04 DIAGNOSIS — M542 Cervicalgia: Secondary | ICD-10-CM

## 2013-12-03 ENCOUNTER — Other Ambulatory Visit (HOSPITAL_COMMUNITY): Payer: Self-pay | Admitting: Neurosurgery

## 2013-12-03 DIAGNOSIS — IMO0002 Reserved for concepts with insufficient information to code with codable children: Secondary | ICD-10-CM

## 2013-12-08 ENCOUNTER — Ambulatory Visit (HOSPITAL_COMMUNITY)
Admission: RE | Admit: 2013-12-08 | Discharge: 2013-12-08 | Disposition: A | Discharge status: Home / Self Care | Payer: BC Managed Care – PPO | Source: Ambulatory Visit | Attending: Neurosurgery | Admitting: Neurosurgery

## 2013-12-08 ENCOUNTER — Encounter (HOSPITAL_COMMUNITY): Payer: Self-pay

## 2013-12-08 DIAGNOSIS — G935 Compression of brain: Secondary | ICD-10-CM | POA: Insufficient documentation

## 2013-12-08 DIAGNOSIS — J3489 Other specified disorders of nose and nasal sinuses: Secondary | ICD-10-CM | POA: Insufficient documentation

## 2013-12-08 DIAGNOSIS — IMO0002 Reserved for concepts with insufficient information to code with codable children: Secondary | ICD-10-CM

## 2014-01-18 ENCOUNTER — Other Ambulatory Visit (HOSPITAL_COMMUNITY): Payer: Self-pay | Admitting: Family Medicine

## 2014-01-18 DIAGNOSIS — E039 Hypothyroidism, unspecified: Secondary | ICD-10-CM

## 2014-01-21 ENCOUNTER — Ambulatory Visit (HOSPITAL_COMMUNITY)
Admission: RE | Admit: 2014-01-21 | Discharge: 2014-01-21 | Disposition: A | Discharge status: Home / Self Care | Payer: BC Managed Care – PPO | Source: Ambulatory Visit | Attending: Family Medicine | Admitting: Family Medicine

## 2014-01-21 DIAGNOSIS — E039 Hypothyroidism, unspecified: Secondary | ICD-10-CM | POA: Insufficient documentation

## 2014-02-02 ENCOUNTER — Other Ambulatory Visit (HOSPITAL_COMMUNITY): Payer: Self-pay | Admitting: Family Medicine

## 2014-02-02 DIAGNOSIS — N63 Unspecified lump in unspecified breast: Secondary | ICD-10-CM

## 2014-02-09 ENCOUNTER — Ambulatory Visit (HOSPITAL_COMMUNITY): Payer: BC Managed Care – PPO

## 2014-02-16 ENCOUNTER — Ambulatory Visit (HOSPITAL_COMMUNITY)
Admission: RE | Admit: 2014-02-16 | Discharge: 2014-02-16 | Disposition: A | Discharge status: Home / Self Care | Payer: BC Managed Care – PPO | Source: Ambulatory Visit | Attending: Family Medicine | Admitting: Family Medicine

## 2014-02-16 DIAGNOSIS — N63 Unspecified lump in unspecified breast: Secondary | ICD-10-CM

## 2014-08-04 ENCOUNTER — Other Ambulatory Visit (HOSPITAL_COMMUNITY): Payer: Self-pay | Admitting: Internal Medicine

## 2014-08-04 DIAGNOSIS — M5442 Lumbago with sciatica, left side: Secondary | ICD-10-CM

## 2014-08-04 DIAGNOSIS — M5126 Other intervertebral disc displacement, lumbar region: Secondary | ICD-10-CM

## 2014-08-10 ENCOUNTER — Ambulatory Visit (HOSPITAL_COMMUNITY)
Admission: RE | Admit: 2014-08-10 | Discharge: 2014-08-10 | Disposition: A | Discharge status: Home / Self Care | Payer: BLUE CROSS/BLUE SHIELD | Source: Ambulatory Visit | Attending: Internal Medicine | Admitting: Internal Medicine

## 2014-08-10 DIAGNOSIS — M5126 Other intervertebral disc displacement, lumbar region: Secondary | ICD-10-CM

## 2014-08-10 DIAGNOSIS — M5442 Lumbago with sciatica, left side: Secondary | ICD-10-CM | POA: Insufficient documentation

## 2014-11-16 ENCOUNTER — Emergency Department (HOSPITAL_COMMUNITY): Payer: BLUE CROSS/BLUE SHIELD

## 2014-11-16 ENCOUNTER — Encounter (HOSPITAL_COMMUNITY): Payer: Self-pay | Admitting: Emergency Medicine

## 2014-11-16 ENCOUNTER — Emergency Department (HOSPITAL_COMMUNITY)
Admission: EM | Admit: 2014-11-16 | Discharge: 2014-11-16 | Disposition: A | Discharge status: Home / Self Care | Payer: BLUE CROSS/BLUE SHIELD | Attending: Emergency Medicine | Admitting: Emergency Medicine

## 2014-11-16 DIAGNOSIS — S5002XA Contusion of left elbow, initial encounter: Secondary | ICD-10-CM | POA: Diagnosis not present

## 2014-11-16 DIAGNOSIS — Y998 Other external cause status: Secondary | ICD-10-CM | POA: Insufficient documentation

## 2014-11-16 DIAGNOSIS — S0003XA Contusion of scalp, initial encounter: Secondary | ICD-10-CM | POA: Diagnosis not present

## 2014-11-16 DIAGNOSIS — Y92009 Unspecified place in unspecified non-institutional (private) residence as the place of occurrence of the external cause: Secondary | ICD-10-CM | POA: Insufficient documentation

## 2014-11-16 DIAGNOSIS — S199XXA Unspecified injury of neck, initial encounter: Secondary | ICD-10-CM | POA: Diagnosis not present

## 2014-11-16 DIAGNOSIS — Z8719 Personal history of other diseases of the digestive system: Secondary | ICD-10-CM | POA: Diagnosis not present

## 2014-11-16 DIAGNOSIS — Z91040 Latex allergy status: Secondary | ICD-10-CM | POA: Insufficient documentation

## 2014-11-16 DIAGNOSIS — S060X0A Concussion without loss of consciousness, initial encounter: Secondary | ICD-10-CM | POA: Insufficient documentation

## 2014-11-16 DIAGNOSIS — Y9389 Activity, other specified: Secondary | ICD-10-CM | POA: Diagnosis not present

## 2014-11-16 DIAGNOSIS — Z87891 Personal history of nicotine dependence: Secondary | ICD-10-CM | POA: Insufficient documentation

## 2014-11-16 DIAGNOSIS — Z87728 Personal history of other specified (corrected) congenital malformations of nervous system and sense organs: Secondary | ICD-10-CM | POA: Insufficient documentation

## 2014-11-16 DIAGNOSIS — Z79899 Other long term (current) drug therapy: Secondary | ICD-10-CM | POA: Diagnosis not present

## 2014-11-16 DIAGNOSIS — S0990XA Unspecified injury of head, initial encounter: Secondary | ICD-10-CM | POA: Diagnosis present

## 2014-11-16 DIAGNOSIS — W01198A Fall on same level from slipping, tripping and stumbling with subsequent striking against other object, initial encounter: Secondary | ICD-10-CM | POA: Insufficient documentation

## 2014-11-16 DIAGNOSIS — Z23 Encounter for immunization: Secondary | ICD-10-CM | POA: Diagnosis not present

## 2014-11-16 DIAGNOSIS — M542 Cervicalgia: Secondary | ICD-10-CM

## 2014-11-16 HISTORY — DX: Reserved for concepts with insufficient information to code with codable children: IMO0002

## 2014-11-16 MED ORDER — TETANUS-DIPHTH-ACELL PERTUSSIS 5-2.5-18.5 LF-MCG/0.5 IM SUSP
0.5000 mL | Freq: Once | INTRAMUSCULAR | Status: AC
  Administered 2014-11-16: 0.5 mL via INTRAMUSCULAR
  Filled 2014-11-16: qty 0.5

## 2014-11-16 MED ORDER — ONDANSETRON 4 MG PO TBDP
4.0000 mg | ORAL_TABLET | Freq: Once | ORAL | Status: AC
  Administered 2014-11-16: 4 mg via ORAL
  Filled 2014-11-16: qty 1

## 2014-11-16 MED ORDER — ONDANSETRON HCL 4 MG PO TABS: 4.0000 mg | ORAL_TABLET | Freq: Four times a day (QID) | ORAL | Status: DC

## 2014-11-16 MED ORDER — HYDROCODONE-ACETAMINOPHEN 5-325 MG PO TABS: 1.0000 | ORAL_TABLET | ORAL | Status: DC | PRN

## 2014-11-16 NOTE — ED Notes (Addendum)
Pt reports tripped and fell this am. Pt reports began to vomit and "feel disoriented" after hitting head. Pt unsure of loc. Pt reports tingling sensation in bilateral hands. Pt alert,speech clear. Pt denies any dizziness,numbness. Pupils equal and reactive to light.

## 2014-11-16 NOTE — ED Provider Notes (Signed)
CSN: [id number]     Arrival date & time 11/16/14  0809 History   First MD Initiated Contact with Patient 11/16/14 [phone]     Chief Complaint  Patient presents with  . Fall     (Consider location/radiation/quality/duration/timing/severity/associated sxs/prior Treatment) Patient is a 29 y.o. female presenting with fall. The history is provided by the patient.  Fall This is a new problem. The current episode started today. The problem occurs constantly. The problem has been gradually worsening. Associated symptoms include headaches, nausea, neck pain and vomiting.   Melanie Thomas is a 29 y.o. female with hx of Chiari malformation presents to the ED with headache, nausea and vomiting s/p fall at approximately 7 am. She states that she tripped over her son's bike inside the house and fell and hit the back of her head. She is unsure of LOC. She remembers the fall but then the next she remembers she was in the bathroom and vomiting. She describes her headache as a numbness feeling. She continues to have nausea.  She also complains of pain of the left elbow.   Past Medical History  Diagnosis Date  . GERD (gastroesophageal reflux disease)   . History of angioedema   . PONV (postoperative nausea and vomiting)   . Chiari malformation    Past Surgical History  Procedure Laterality Date  . Cesarean section  2006    [facility]  . Dental surgery  2004, 2009  . Anal fissure repair N/A 11/21/2012    Procedure: ANAL FISSURECTOMY;  Surgeon: Jamesetta So, MD;  Location: [facility];  Service: General;  Laterality: N/A;   Family History  Problem Relation Age of Onset  . Hypothyroidism Mother   . Hypothyroidism Other   . Hypertension Other   . Diabetes Other   . Breast cancer Other   . Heart disease Other   . Hypertension Other   . Diabetes Other   . Hypertension Other   . Diabetes Other   . Breast cancer Other   . Hypertension Other   . Diabetes Other    History  Substance Use Topics    . Smoking status: Former Smoker -- 0.50 packs/day for 2 years    Types: Cigarettes  . Smokeless tobacco: Not on file  . Alcohol Use: Yes     Comment: rare use   OB History    No data available     Review of Systems  Eyes: Positive for visual disturbance.  Gastrointestinal: Positive for nausea and vomiting.  Musculoskeletal: Positive for neck pain.       Left elbow pain  Neurological: Positive for light-headedness and headaches.      Allergies  Aspirin; Shrimp; Excedrin tension headache; Ibuprofen; Other; and Latex  Home Medications   Prior to Admission medications   Medication Sig Start Date End Date Taking? Authorizing Provider  EPINEPHrine (EPIPEN JR) 0.15 MG/0.3ML injection Inject 0.15 mg into the muscle as needed for anaphylaxis.   Yes Historical Provider, MD  fexofenadine (ALLEGRA) 180 MG tablet Take 180 mg by mouth 2 (two) times daily.   Yes Historical Provider, MD  HYDROcodone-acetaminophen (NORCO/VICODIN) 5-325 MG per tablet Take 1 tablet by mouth every 4 (four) hours as needed. 11/16/14   Hope Bunnie Pion, NP  ondansetron (ZOFRAN) 4 MG tablet Take 1 tablet (4 mg total) by mouth every 6 (six) hours. 11/16/14   Hope Bunnie Pion, NP  predniSONE (DELTASONE) 20 MG tablet Take 1 tablet (20 mg total) by mouth 2 (two) times  daily. Patient not taking: Reported on 11/16/2014 02/05/13   Daleen Bo, MD   BP 135/85 mmHg  Pulse 95  Temp(Src) 98.2 F (36.8 C) (Oral)  Resp 18  Ht 5\' 3"  (1.6 m)  Wt 195 lb (88.451 kg)  BMI 34.55 kg/m2  SpO2 100%  LMP 10/16/2014 Physical Exam  Constitutional: She is oriented to person, place, and time. She appears well-developed and well-nourished. No distress.  HENT:  Head: Head is with contusion.    Right Ear: Tympanic membrane normal.  Left Ear: Tympanic membrane normal.  Nose: Nose normal.  Mouth/Throat: Uvula is midline, oropharynx is clear and moist and mucous membranes are normal.  Hematoma posterior scalp.   Eyes: Conjunctivae and EOM  are normal. Pupils are equal, round, and reactive to light.  Neck: Normal range of motion. Neck supple.  Cardiovascular: Normal rate and regular rhythm.   Pulmonary/Chest: Effort normal. She has no wheezes. She has no rales.  Abdominal: Soft. Bowel sounds are normal. She exhibits no mass. There is no tenderness.  Musculoskeletal: She exhibits no edema.       Left elbow: She exhibits normal range of motion and no swelling. Tenderness found.       Cervical back: She exhibits tenderness and spasm. She exhibits normal pulse. Decreased range of motion: due to pain.       Back:  Radial and pedal pulses strong, adequate circulation, good touch sensation. Abrasion to left elbow.   Neurological: She is alert and oriented to person, place, and time. She has normal strength. No cranial nerve deficit or sensory deficit. She displays a negative Romberg sign. Gait normal.  Reflex Scores:      Bicep reflexes are 2+ on the right side and 2+ on the left side.      Brachioradialis reflexes are 2+ on the right side and 2+ on the left side.      Patellar reflexes are 2+ on the right side and 2+ on the left side.      Achilles reflexes are 2+ on the right side and 2+ on the left side. Rapid alternating movement without difficulty. Stands on one foot without difficulty.  Psychiatric: She has a normal mood and affect. Her behavior is normal.    ED Course  Procedures (including critical care time) Labs Review Labs Reviewed - No data to display  Imaging Review Dg Elbow Complete Left  11/16/2014   CLINICAL DATA:  Fall this morning, posterior elbow bruising  EXAM: LEFT ELBOW - COMPLETE 3+ VIEW  COMPARISON:  None.  FINDINGS: Four views of the left elbow submitted. No acute fracture or subluxation. No radiopaque foreign body. No posterior fat pad sign.  IMPRESSION: Negative.   Electronically Signed   By: Lahoma Crocker M.D.   On: 11/16/2014 09:25   Ct Head Wo Contrast  11/16/2014   CLINICAL DATA:  Fall this morning.   Disorientation.  Head trauma.  EXAM: CT HEAD WITHOUT CONTRAST  CT CERVICAL SPINE WITHOUT CONTRAST  TECHNIQUE: Multidetector CT imaging of the head and cervical spine was performed following the standard protocol without intravenous contrast. Multiplanar CT image reconstructions of the cervical spine were also generated.  COMPARISON:  Cervical spine MRI 11/04/2013.  FINDINGS: CT HEAD FINDINGS  No mass lesion, mass effect, midline shift, hydrocephalus, hemorrhage. No territorial ischemia or acute infarction. Calvarium intact. Chiari malformation is again noted with fullness of the foramen magnum.  CT CERVICAL SPINE FINDINGS  Reversal of the normal cervical lordosis centered around C5-C6. No cervical  spine fracture or dislocation. Paraspinal soft tissues appear normal. Lung apices are within normal limits. Comparing to prior MRI of 11/04/2013, no interval change or acute abnormality.  IMPRESSION: 1. No acute intracranial abnormality. 2. Chiari malformation.  No interval change from prior MRI and CT. 3. Negative CT cervical spine.   Electronically Signed   By: Dereck Ligas M.D.   On: 11/16/2014 09:29   Ct Cervical Spine Wo Contrast  11/16/2014   CLINICAL DATA:  Fall this morning.  Disorientation.  Head trauma.  EXAM: CT HEAD WITHOUT CONTRAST  CT CERVICAL SPINE WITHOUT CONTRAST  TECHNIQUE: Multidetector CT imaging of the head and cervical spine was performed following the standard protocol without intravenous contrast. Multiplanar CT image reconstructions of the cervical spine were also generated.  COMPARISON:  Cervical spine MRI 11/04/2013.  FINDINGS: CT HEAD FINDINGS  No mass lesion, mass effect, midline shift, hydrocephalus, hemorrhage. No territorial ischemia or acute infarction. Calvarium intact. Chiari malformation is again noted with fullness of the foramen magnum.  CT CERVICAL SPINE FINDINGS  Reversal of the normal cervical lordosis centered around C5-C6. No cervical spine fracture or dislocation.  Paraspinal soft tissues appear normal. Lung apices are within normal limits. Comparing to prior MRI of 11/04/2013, no interval change or acute abnormality.  IMPRESSION: 1. No acute intracranial abnormality. 2. Chiari malformation.  No interval change from prior MRI and CT. 3. Negative CT cervical spine.   Electronically Signed   By: Dereck Ligas M.D.   On: 11/16/2014 09:29     MDM  29 y.o. female with headache and neck pain and left elbow pain s/p fall. Stable for d/c with no focal neuro deficits. CT scan without acute findings. D/c with head injury precautions and instructions on concussion. Will treat for pain and nausea. Discussed with the patient and all questioned fully answered. She will return if any problems arise.   Final diagnoses:  Neck pain  Concussion, without loss of consciousness, initial encounter  Left elbow contusion, initial encounter  Hematoma of scalp, initial encounter       [facility], NP 11/16/14 [place], MD 11/22/14 [phone]

## 2014-11-16 NOTE — ED Notes (Signed)
Pt to have a tetanus shot before discharge.

## 2014-11-16 NOTE — Discharge Instructions (Signed)
Be sure to have someone stay with you for the next 24 hours. Do not drive.

## 2015-11-14 DIAGNOSIS — G4452 New daily persistent headache (NDPH): Secondary | ICD-10-CM | POA: Insufficient documentation

## 2015-12-12 DIAGNOSIS — G935 Compression of brain: Secondary | ICD-10-CM | POA: Insufficient documentation

## 2015-12-13 ENCOUNTER — Encounter (HOSPITAL_COMMUNITY): Payer: Self-pay | Admitting: Emergency Medicine

## 2015-12-13 ENCOUNTER — Emergency Department (HOSPITAL_COMMUNITY)
Admission: EM | Admit: 2015-12-13 | Discharge: 2015-12-14 | Disposition: A | Discharge status: Home / Self Care | Payer: Medicaid Other | Attending: Emergency Medicine | Admitting: Emergency Medicine

## 2015-12-13 ENCOUNTER — Emergency Department (HOSPITAL_COMMUNITY): Payer: Medicaid Other

## 2015-12-13 DIAGNOSIS — E039 Hypothyroidism, unspecified: Secondary | ICD-10-CM | POA: Insufficient documentation

## 2015-12-13 DIAGNOSIS — M2540 Effusion, unspecified joint: Secondary | ICD-10-CM | POA: Diagnosis not present

## 2015-12-13 DIAGNOSIS — R0789 Other chest pain: Secondary | ICD-10-CM | POA: Insufficient documentation

## 2015-12-13 DIAGNOSIS — Z79899 Other long term (current) drug therapy: Secondary | ICD-10-CM | POA: Diagnosis not present

## 2015-12-13 DIAGNOSIS — Z87891 Personal history of nicotine dependence: Secondary | ICD-10-CM | POA: Diagnosis not present

## 2015-12-13 NOTE — ED Provider Notes (Signed)
CSN: [id number]     Arrival date & time 12/13/15  2259 History  By signing my name below, I, Nicole Kindred, attest that this documentation has been prepared under the direction and in the presence of No att. providers found.   Electronically Signed: Nicole Kindred, ED Scribe. 12/14/2015. 5:35 AM   Chief Complaint  Patient presents with  . Chest Pain   The history is provided by the patient. No language interpreter was used.   HPI Comments: Melanie Thomas is a 30 y.o. female with PMHx of chiari malformation, hypothyroidism, and angioedema who presents to the Emergency Department complaining of gradual onset, 6/10, chest pain and chest tightness, beginning today. Pt is being seen at [facility] for a migraine disorder and began verapamil yesterday. Pt states her symptoms began about an hour after taking her medication today. Pt reports left ankle swelling last night. No other associated symptoms noted. Pt has taken aleve with some relief to symptoms. Pt denies new neurological symptoms, shortness of breath, or any other pertinent symptoms. Denies history of blood clots.  Past Medical History  Diagnosis Date  . GERD (gastroesophageal reflux disease)   . History of angioedema   . PONV (postoperative nausea and vomiting)   . Chiari malformation    Past Surgical History  Procedure Laterality Date  . Cesarean section  2006    [facility]  . Dental surgery  2004, 2009  . Anal fissure repair N/A 11/21/2012    Procedure: ANAL FISSURECTOMY;  Surgeon: Jamesetta So, MD;  Location: [facility];  Service: General;  Laterality: N/A;   Family History  Problem Relation Age of Onset  . Hypothyroidism Mother   . Hypothyroidism Other   . Hypertension Other   . Diabetes Other   . Breast cancer Other   . Heart disease Other   . Hypertension Other   . Diabetes Other   . Hypertension Other   . Diabetes Other   . Breast cancer Other   . Hypertension Other   . Diabetes Other    Social History   Substance Use Topics  . Smoking status: Former Smoker -- 0.50 packs/day for 2 years    Types: Cigarettes  . Smokeless tobacco: None  . Alcohol Use: Yes     Comment: rare use   OB History    No data available     Review of Systems  Constitutional: Negative for fever.  Respiratory: Positive for chest tightness. Negative for shortness of breath.   Cardiovascular: Positive for chest pain.  Musculoskeletal: Positive for joint swelling.  All other systems reviewed and are negative.   Allergies  Aspirin; Shrimp; Excedrin tension headache; Ibuprofen; Other; and Latex  Home Medications   Prior to Admission medications   Medication Sig Start Date End Date Taking? Authorizing Provider  EPINEPHrine (EPIPEN JR) 0.15 MG/0.3ML injection Inject 0.15 mg into the muscle as needed for anaphylaxis.   Yes Historical Provider, MD  fexofenadine (ALLEGRA) 180 MG tablet Take 180 mg by mouth 2 (two) times daily.   Yes Historical Provider, MD  levothyroxine (SYNTHROID, LEVOTHROID) 75 MCG tablet Take 75 mcg by mouth daily before breakfast.   Yes Historical Provider, MD  promethazine (PHENERGAN) 12.5 MG tablet Take 12.5 mg by mouth every 6 (six) hours as needed for nausea or vomiting.   Yes Historical Provider, MD  venlafaxine (EFFEXOR) 37.5 MG tablet Take 37.5 mg by mouth 3 (three) times daily with meals.   Yes Historical Provider, MD  verapamil (CALAN) 120 MG  tablet Take 120 mg by mouth 3 (three) times daily.   Yes Historical Provider, MD  HYDROcodone-acetaminophen (NORCO/VICODIN) 5-325 MG per tablet Take 1 tablet by mouth every 4 (four) hours as needed. 11/16/14   Hope Bunnie Pion, NP  ondansetron (ZOFRAN) 4 MG tablet Take 1 tablet (4 mg total) by mouth every 6 (six) hours. 11/16/14   Hope Bunnie Pion, NP  predniSONE (DELTASONE) 20 MG tablet Take 1 tablet (20 mg total) by mouth 2 (two) times daily. Patient not taking: Reported on 11/16/2014 02/05/13   Daleen Bo, MD   BP 122/77 mmHg  Pulse 97  Temp(Src) 97.8  F (36.6 C) (Oral)  Resp 16  Ht 5\' 3"  (1.6 m)  Wt 208 lb (94.348 kg)  BMI 36.85 kg/m2  SpO2 100%  LMP 11/29/2015 Physical Exam  Constitutional: She is oriented to person, place, and time. She appears well-developed and well-nourished.  Overweight  HENT:  Head: Normocephalic and atraumatic.  Right facial droop noted  Eyes: Pupils are equal, round, and reactive to light.  Cardiovascular: Normal rate, regular rhythm and normal heart sounds.   No murmur heard. Pulmonary/Chest: Effort normal and breath sounds normal. No respiratory distress. She has no wheezes. She exhibits no tenderness.  Abdominal: Soft. Bowel sounds are normal. There is no tenderness. There is no rebound.  Musculoskeletal: She exhibits no edema.  Neurological: She is alert and oriented to person, place, and time.  Right facial droop noted  Skin: Skin is warm and dry.  Psychiatric: She has a normal mood and affect.  Nursing note and vitals reviewed.   ED Course  Procedures (including critical care time) DIAGNOSTIC STUDIES: Oxygen Saturation is 100% on RA, normal by my interpretation.    COORDINATION OF CARE: 11:54 PM Discussed treatment plan with pt at bedside and pt agreed to plan.  Labs Review Labs Reviewed  BASIC METABOLIC PANEL - Abnormal; Notable for the following:    Potassium 3.3 (*)    Glucose, Bld 103 (*)    Calcium 8.7 (*)    Anion gap 4 (*)    All other components within normal limits  CBC  D-DIMER, QUANTITATIVE (NOT AT [facility])  Randolm Idol, ED    Imaging Review Dg Chest 2 View  12/14/2015  CLINICAL DATA:  Gradual onset chest pain beginning today. EXAM: CHEST  2 VIEW COMPARISON:  02/05/2013 FINDINGS: The heart size and mediastinal contours are within normal limits. Both lungs are clear. The visualized skeletal structures are unremarkable. IMPRESSION: No active cardiopulmonary disease. Electronically Signed   By: Lucienne Capers M.D.   On: 12/14/2015 01:05   I have personally reviewed  and evaluated these images and lab results as part of my medical decision-making.   EKG Interpretation   Date/Time:  Tuesday Dec 13 2015 23:19:49 EDT Ventricular Rate:  97 PR Interval:  133 QRS Duration: 94 QT Interval:  358 QTC Calculation: 455 R Axis:   54 Text Interpretation:  Sinus rhythm Confirmed by Tannah Dreyfuss  MD, Roy  [id number]) on 12/13/2015 11:43:49 PM      MDM   Final diagnoses:  Other chest pain   Patient presents for chest pain. She relates chest pain to no medication verapamil. She is nontoxic-appearing. Low risk for PE. EKG shows sinus rhythm. Chest x-ray without evidence of pneumothorax or pneumonia. Troponin is negative. D-dimer is negative. Patient has multiple allergies to pain medication. On recheck, she reports improvement of pain. She is well-appearing. Will have patient follow-up with her primary physician and prescriber of  verapamil regarding possible alternatives.   After history, exam, and medical workup I feel the patient has been appropriately medically screened and is safe for discharge home. Pertinent diagnoses were discussed with the patient. Patient was given return precautions.  I personally performed the services described in this documentation, which was scribed in my presence. The recorded information has been reviewed and is accurate.      Merryl Hacker, MD 12/14/15 [phone]

## 2015-12-13 NOTE — ED Notes (Signed)
Pt states she is being treated at [facility] for a type of migraine disorder and started new meds yesterday. Pt c/o chest tightness that started today.

## 2015-12-14 LAB — CBC
HCT: 37.4 % (ref 36.0–46.0)
Hemoglobin: 12.7 g/dL (ref 12.0–15.0)
MCH: 29.5 pg (ref 26.0–34.0)
MCHC: 34 g/dL (ref 30.0–36.0)
MCV: 87 fL (ref 78.0–100.0)
Platelets: 290 10*3/uL (ref 150–400)
RBC: 4.3 MIL/uL (ref 3.87–5.11)
RDW: 12.3 % (ref 11.5–15.5)
WBC: 8.6 10*3/uL (ref 4.0–10.5)

## 2015-12-14 LAB — BASIC METABOLIC PANEL
Anion gap: 4 — ABNORMAL LOW (ref 5–15)
BUN: 10 mg/dL (ref 6–20)
CHLORIDE: 105 mmol/L (ref 101–111)
CO2: 29 mmol/L (ref 22–32)
Calcium: 8.7 mg/dL — ABNORMAL LOW (ref 8.9–10.3)
Creatinine, Ser: 0.94 mg/dL (ref 0.44–1.00)
GFR calc Af Amer: >60 mL/min (ref >60)
GFR calc non Af Amer: >60 mL/min (ref >60)
GLUCOSE: 103 mg/dL — AB (ref 65–99)
Potassium: 3.3 mmol/L — ABNORMAL LOW (ref 3.5–5.1)
Sodium: 138 mmol/L (ref 135–145)

## 2015-12-14 LAB — I-STAT TROPONIN, ED: Troponin i, poc: 0 ng/mL (ref 0.00–0.08)

## 2015-12-14 LAB — D-DIMER, QUANTITATIVE: D-Dimer, Quant: 0.47 ug/mL-FEU (ref 0.00–0.50)

## 2016-02-29 HISTORY — PX: CRANIECTOMY SUBOCCIPITAL W/ CERVICAL LAMINECTOMY / CHIARI: SUR327

## 2016-08-02 ENCOUNTER — Encounter (HOSPITAL_COMMUNITY): Payer: Self-pay

## 2016-08-02 ENCOUNTER — Encounter (HOSPITAL_COMMUNITY)
Admission: RE | Admit: 2016-08-02 | Discharge: 2016-08-02 | Disposition: A | Discharge status: Home / Self Care | Payer: Medicaid Other | Source: Ambulatory Visit | Attending: General Surgery | Admitting: General Surgery

## 2016-08-02 DIAGNOSIS — Z01812 Encounter for preprocedural laboratory examination: Secondary | ICD-10-CM | POA: Insufficient documentation

## 2016-08-02 LAB — CBC WITH DIFFERENTIAL/PLATELET
Basophils Absolute: 0 10*3/uL (ref 0.0–0.1)
Basophils Relative: 0 %
Eosinophils Absolute: 0.1 10*3/uL (ref 0.0–0.7)
Eosinophils Relative: 1 %
HCT: 39 % (ref 36.0–46.0)
Hemoglobin: 13.1 g/dL (ref 12.0–15.0)
Lymphocytes Relative: 19 %
Lymphs Abs: 1.9 10*3/uL (ref 0.7–4.0)
MCH: 28.9 pg (ref 26.0–34.0)
MCHC: 33.6 g/dL (ref 30.0–36.0)
MCV: 86.1 fL (ref 78.0–100.0)
Monocytes Absolute: 0.4 10*3/uL (ref 0.1–1.0)
Monocytes Relative: 4 %
Neutro Abs: 7.4 10*3/uL (ref 1.7–7.7)
Neutrophils Relative %: 76 %
Platelets: 294 10*3/uL (ref 150–400)
RBC: 4.53 MIL/uL (ref 3.87–5.11)
RDW: 13.2 % (ref 11.5–15.5)
WBC: 9.8 10*3/uL (ref 4.0–10.5)

## 2016-08-02 LAB — BASIC METABOLIC PANEL
ANION GAP: 6 (ref 5–15)
BUN: 11 mg/dL (ref 6–20)
CALCIUM: 9 mg/dL (ref 8.9–10.3)
CO2: 25 mmol/L (ref 22–32)
Chloride: 106 mmol/L (ref 101–111)
Creatinine, Ser: 0.76 mg/dL (ref 0.44–1.00)
GFR calc Af Amer: >60 mL/min (ref >60)
GFR calc non Af Amer: >60 mL/min (ref >60)
Glucose, Bld: 143 mg/dL — ABNORMAL HIGH (ref 65–99)
Potassium: 3.5 mmol/L (ref 3.5–5.1)
Sodium: 137 mmol/L (ref 135–145)

## 2016-08-02 LAB — HEPATIC FUNCTION PANEL
ALT: 62 U/L — ABNORMAL HIGH (ref 14–54)
AST: 28 U/L (ref 15–41)
Albumin: 3.9 g/dL (ref 3.5–5.0)
Alkaline Phosphatase: 113 U/L (ref 38–126)
Bilirubin, Direct: 0.1 mg/dL (ref 0.1–0.5)
Indirect Bilirubin: 0.6 mg/dL (ref 0.3–0.9)
Total Bilirubin: 0.7 mg/dL (ref 0.3–1.2)
Total Protein: 7.8 g/dL (ref 6.5–8.1)

## 2016-08-02 LAB — HCG, SERUM, QUALITATIVE: Preg, Serum: NEGATIVE

## 2016-08-02 NOTE — Patient Instructions (Addendum)
Melanie Thomas  08/02/2016     @PREFPERIOPPHARMACY @   Your procedure is scheduled on  08/06/2016.  Report to [facility] at  615 A.M.  Call this number if you have problems the morning of surgery:  [phone]   Remember:  Do not eat food or drink liquids after midnight.  Take these medicines the morning of surgery with A SIP OF WATER  Zyrtec,  synthroid, zofran or phenergan  Do not wear jewelry, make-up or nail polish.  Do not wear lotions, powders, or perfumes, or deoderant.  Do not shave 48 hours prior to surgery.  Men may shave face and neck.  Do not bring valuables to the hospital.  [facility] is not responsible for any belongings or valuables.  Contacts, dentures or bridgework may not be worn into surgery.  Leave your suitcase in the car.  After surgery it may be brought to your room.  For patients admitted to the hospital, discharge time will be determined by your treatment team.  Patients discharged the day of surgery will not be allowed to drive home.   Name and phone number of your driver:   family Special instructions:  none  Please read over the following fact sheets that you were given. Anesthesia Post-op Instructions and Care and Recovery After Surgery       Laparoscopic Cholecystectomy Laparoscopic cholecystectomy is surgery to remove the gallbladder. The gallbladder is a pear-shaped organ that lies beneath the liver on the right side of the body. The gallbladder stores bile, which is a fluid that helps the body to digest fats. Cholecystectomy is often done for inflammation of the gallbladder (cholecystitis). This condition is usually caused by a buildup of gallstones (cholelithiasis) in the gallbladder. Gallstones can block the flow of bile, which can result in inflammation and pain. In severe cases, emergency surgery may be required. This procedure is done though small incisions in your abdomen (laparoscopic surgery). A thin scope with a  camera (laparoscope) is inserted through one incision. Thin surgical instruments are inserted through the other incisions. In some cases, a laparoscopic procedure may be turned into a type of surgery that is done through a larger incision (open surgery). Tell a health care provider about:  Any allergies you have.  All medicines you are taking, including vitamins, herbs, eye drops, creams, and over-the-counter medicines.  Any problems you or family members have had with anesthetic medicines.  Any blood disorders you have.  Any surgeries you have had.  Any medical conditions you have.  Whether you are pregnant or may be pregnant. What are the risks? Generally, this is a safe procedure. However, problems may occur, including:  Infection.  Bleeding.  Allergic reactions to medicines.  Damage to other structures or organs.  A stone remaining in the common bile duct. The common bile duct carries bile from the gallbladder into the small intestine.  A bile leak from the cyst duct that is clipped when your gallbladder is removed. What happens before the procedure? Staying hydrated  Follow instructions from your health care provider about hydration, which may include:  Up to 2 hours before the procedure - you may continue to drink clear liquids, such as water, clear fruit juice, black coffee, and plain tea. Eating and drinking restrictions  Follow instructions from your health care provider about eating and drinking, which may include:  8 hours before the procedure - stop eating heavy meals or foods such  as meat, fried foods, or fatty foods.  6 hours before the procedure - stop eating light meals or foods, such as toast or cereal.  6 hours before the procedure - stop drinking milk or drinks that contain milk.  2 hours before the procedure - stop drinking clear liquids. Medicines  Ask your health care provider about:  Changing or stopping your regular medicines. This is  especially important if you are taking diabetes medicines or blood thinners.  Taking medicines such as aspirin and ibuprofen. These medicines can thin your blood. Do not take these medicines before your procedure if your health care provider instructs you not to.  You may be given antibiotic medicine to help prevent infection. General instructions  Let your health care provider know if you develop a cold or an infection before surgery.  Plan to have someone take you home from the hospital or clinic.  Ask your health care provider how your surgical site will be marked or identified. What happens during the procedure?  To reduce your risk of infection:  Your health care team will wash or sanitize their hands.  Your skin will be washed with soap.  Hair may be removed from the surgical area.  An IV tube may be inserted into one of your veins.  You will be given one or more of the following:  A medicine to help you relax (sedative).  A medicine to make you fall asleep (general anesthetic).  A breathing tube will be placed in your mouth.  Your surgeon will make several small cuts (incisions) in your abdomen.  The laparoscope will be inserted through one of the small incisions. The camera on the laparoscope will send images to a TV screen (monitor) in the operating room. This lets your surgeon see inside your abdomen.  Air-like gas will be pumped into your abdomen. This will expand your abdomen to give the surgeon more room to perform the surgery.  Other tools that are needed for the procedure will be inserted through the other incisions. The gallbladder will be removed through one of the incisions.  Your common bile duct may be examined. If stones are found in the common bile duct, they may be removed.  After your gallbladder has been removed, the incisions will be closed with stitches (sutures), staples, or skin glue.  Your incisions may be covered with a bandage  (dressing). The procedure may vary among health care providers and hospitals. What happens after the procedure?  Your blood pressure, heart rate, breathing rate, and blood oxygen level will be monitored until the medicines you were given have worn off.  You will be given medicines as needed to control your pain.  Do not drive for 24 hours if you were given a sedative. This information is not intended to replace advice given to you by your health care provider. Make sure you discuss any questions you have with your health care provider. Document Released: 07/16/2005 Document Revised: 02/05/2016 Document Reviewed: 01/02/2016 Elsevier Interactive Patient Education  2017 [employer]  Laparoscopic Cholecystectomy, Care After This sheet gives you information about how to care for yourself after your procedure. Your health care provider may also give you more specific instructions. If you have problems or questions, contact your health care provider. What can I expect after the procedure? After the procedure, it is common to have:  Pain at your incision sites. You will be given medicines to control this pain.  Mild nausea or vomiting.  Bloating and possible  shoulder pain from the air-like gas that was used during the procedure. Follow these instructions at home: Incision care  Follow instructions from your health care provider about how to take care of your incisions. Make sure you:  Wash your hands with soap and water before you change your bandage (dressing). If soap and water are not available, use hand sanitizer.  Change your dressing as told by your health care provider.  Leave stitches (sutures), skin glue, or adhesive strips in place. These skin closures may need to be in place for 2 weeks or longer. If adhesive strip edges start to loosen and curl up, you may trim the loose edges. Do not remove adhesive strips completely unless your health care provider tells you to do that.  Do  not take baths, swim, or use a hot tub until your health care provider approves. Ask your health care provider if you can take showers. You may only be allowed to take sponge baths for bathing.  Check your incision area every day for signs of infection. Check for:  More redness, swelling, or pain.  More fluid or blood.  Warmth.  Pus or a bad smell. Activity  Do not drive or use heavy machinery while taking prescription pain medicine.  Do not lift anything that is heavier than 10 lb (4.5 kg) until your health care provider approves.  Do not play contact sports until your health care provider approves.  Do not drive for 24 hours if you were given a medicine to help you relax (sedative).  Rest as needed. Do not return to work or school until your health care provider approves. General instructions  Take over-the-counter and prescription medicines only as told by your health care provider.  To prevent or treat constipation while you are taking prescription pain medicine, your health care provider may recommend that you:  Drink enough fluid to keep your urine clear or pale yellow.  Take over-the-counter or prescription medicines.  Eat foods that are high in fiber, such as fresh fruits and vegetables, whole grains, and beans.  Limit foods that are high in fat and processed sugars, such as fried and sweet foods. Contact a health care provider if:  You develop a rash.  You have more redness, swelling, or pain around your incisions.  You have more fluid or blood coming from your incisions.  Your incisions feel warm to the touch.  You have pus or a bad smell coming from your incisions.  You have a fever.  One or more of your incisions breaks open. Get help right away if:  You have trouble breathing.  You have chest pain.  You have increasing pain in your shoulders.  You faint or feel dizzy when you stand.  You have severe pain in your abdomen.  You have nausea or  vomiting that lasts for more than one day.  You have leg pain. This information is not intended to replace advice given to you by your health care provider. Make sure you discuss any questions you have with your health care provider. Document Released: 07/16/2005 Document Revised: 02/04/2016 Document Reviewed: 01/02/2016 Elsevier Interactive Patient Education  2017 [place] Anesthesia, Adult General anesthesia is the use of medicines to make a person "go to sleep" (be unconscious) for a medical procedure. General anesthesia is often recommended when a procedure:  Is long.  Requires you to be still or in an unusual position.  Is major and can cause you to lose blood.  Is impossible to do without general anesthesia. The medicines used for general anesthesia are called general anesthetics. In addition to making you sleep, the medicines:  Prevent pain.  Control your blood pressure.  Relax your muscles. Tell a health care provider about:  Any allergies you have.  All medicines you are taking, including vitamins, herbs, eye drops, creams, and over-the-counter medicines.  Any problems you or family members have had with anesthetic medicines.  Types of anesthetics you have had in the past.  Any bleeding disorders you have.  Any surgeries you have had.  Any medical conditions you have.  Any history of heart or lung conditions, such as heart failure, sleep apnea, or chronic obstructive pulmonary disease (COPD).  Whether you are pregnant or may be pregnant.  Whether you use tobacco, alcohol, marijuana, or street drugs.  Any history of Armed forces logistics/support/administrative officer.  Any history of depression or anxiety. What are the risks? Generally, this is a safe procedure. However, problems may occur, including:  Allergic reaction to anesthetics.  Lung and heart problems.  Inhaling food or liquids from your stomach into your lungs (aspiration).  Injury to nerves.  Waking up  during your procedure and being unable to move (rare).  Extreme agitation or a state of mental confusion (delirium) when you wake up from the anesthetic.  Air in the bloodstream, which can lead to stroke. These problems are more likely to develop if you are having a major surgery or if you have an advanced medical condition. You can prevent some of these complications by answering all of your health care provider's questions thoroughly and by following all pre-procedure instructions. General anesthesia can cause side effects, including:  Nausea or vomiting  A sore throat from the breathing tube.  Feeling cold or shivery.  Feeling tired, washed out, or achy.  Sleepiness or drowsiness.  Confusion or agitation. What happens before the procedure? Staying hydrated  Follow instructions from your health care provider about hydration, which may include:  Up to 2 hours before the procedure - you may continue to drink clear liquids, such as water, clear fruit juice, black coffee, and plain tea. Eating and drinking restrictions  Follow instructions from your health care provider about eating and drinking, which may include:  8 hours before the procedure - stop eating heavy meals or foods such as meat, fried foods, or fatty foods.  6 hours before the procedure - stop eating light meals or foods, such as toast or cereal.  6 hours before the procedure - stop drinking milk or drinks that contain milk.  2 hours before the procedure - stop drinking clear liquids. Medicines  Ask your health care provider about:  Changing or stopping your regular medicines. This is especially important if you are taking diabetes medicines or blood thinners.  Taking medicines such as aspirin and ibuprofen. These medicines can thin your blood. Do not take these medicines before your procedure if your health care provider instructs you not to.  Taking new dietary supplements or medicines. Do not take these during  the week before your procedure unless your health care provider approves them.  If you are told to take a medicine or to continue taking a medicine on the day of the procedure, take the medicine with sips of water. General instructions   Ask if you will be going home the same day, the following day, or after a longer hospital stay.  Plan to have someone take you home.  Plan to have someone stay  with you for the first 24 hours after you leave the hospital or clinic.  For 3-6 weeks before the procedure, try not to use any tobacco products, such as cigarettes, chewing tobacco, and e-cigarettes.  You may brush your teeth on the morning of the procedure, but make sure to spit out the toothpaste. What happens during the procedure?  You will be given anesthetics through a mask and through an IV tube in one of your veins.  You may receive medicine to help you relax (sedative).  As soon as you are asleep, a breathing tube may be used to help you breathe.  An anesthesia specialist will stay with you throughout the procedure. He or she will help keep you comfortable and safe by continuing to give you medicines and adjusting the amount of medicine that you get. He or she will also watch your blood pressure, pulse, and oxygen levels to make sure that the anesthetics do not cause any problems.  If a breathing tube was used to help you breathe, it will be removed before you wake up. The procedure may vary among health care providers and hospitals. What happens after the procedure?  You will wake up, often slowly, after the procedure is complete, usually in a recovery area.  Your blood pressure, heart rate, breathing rate, and blood oxygen level will be monitored until the medicines you were given have worn off.  You may be given medicine to help you calm down if you feel anxious or agitated.  If you will be going home the same day, your health care provider may check to make sure you can stand,  drink, and urinate.  Your health care providers will treat your pain and side effects before you go home.  Do not drive for 24 hours if you received a sedative.  You may:  Feel nauseous and vomit.  Have a sore throat.  Have mental slowness.  Feel cold or shivery.  Feel sleepy.  Feel tired.  Feel sore or achy, even in parts of your body where you did not have surgery. This information is not intended to replace advice given to you by your health care provider. Make sure you discuss any questions you have with your health care provider. Document Released: 10/23/2007 Document Revised: 12/27/2015 Document Reviewed: 06/30/2015 Elsevier Interactive Patient Education  2017 [place] Anesthesia, Adult, Care After These instructions provide you with information about caring for yourself after your procedure. Your health care provider may also give you more specific instructions. Your treatment has been planned according to current medical practices, but problems sometimes occur. Call your health care provider if you have any problems or questions after your procedure. What can I expect after the procedure? After the procedure, it is common to have:  Vomiting.  A sore throat.  Mental slowness. It is common to feel:  Nauseous.  Cold or shivery.  Sleepy.  Tired.  Sore or achy, even in parts of your body where you did not have surgery. Follow these instructions at home: For at least 24 hours after the procedure:  Do not:  Participate in activities where you could fall or become injured.  Drive.  Use heavy machinery.  Drink alcohol.  Take sleeping pills or medicines that cause drowsiness.  Make important decisions or sign legal documents.  Take care of children on your own.  Rest. Eating and drinking  If you vomit, drink water, juice, or soup when you can drink without vomiting.  Drink enough  fluid to keep your urine clear or pale yellow.  Make  sure you have little or no nausea before eating solid foods.  Follow the diet recommended by your health care provider. General instructions  Have a responsible adult stay with you until you are awake and alert.  Return to your normal activities as told by your health care provider. Ask your health care provider what activities are safe for you.  Take over-the-counter and prescription medicines only as told by your health care provider.  If you smoke, do not smoke without supervision.  Keep all follow-up visits as told by your health care provider. This is important. Contact a health care provider if:  You continue to have nausea or vomiting at home, and medicines are not helpful.  You cannot drink fluids or start eating again.  You cannot urinate after 8-12 hours.  You develop a skin rash.  You have fever.  You have increasing redness at the site of your procedure. Get help right away if:  You have difficulty breathing.  You have chest pain.  You have unexpected bleeding.  You feel that you are having a life-threatening or urgent problem. This information is not intended to replace advice given to you by your health care provider. Make sure you discuss any questions you have with your health care provider. Document Released: 10/22/2000 Document Revised: 12/19/2015 Document Reviewed: 06/30/2015 Elsevier Interactive Patient Education  2017 [employer].

## 2016-08-03 ENCOUNTER — Other Ambulatory Visit (HOSPITAL_COMMUNITY): Payer: BLUE CROSS/BLUE SHIELD

## 2016-08-03 NOTE — H&P (Signed)
  NTS SOAP Note  Vital Signs:  Vitals as of: 0000000: Systolic [id number]: Diastolic 80: Heart Rate 96: Temp 99.89F (Temporal): Height 80ft 8in: Weight 217Lbs 0 Ounces: Pain Level 6: BMI 18.03   BMI : 18.03 kg/m2  Subjective: This 31 year old female presents for of biliary colic and epigastric pain.  Referred by Dr. Hilma Favors.  Has been having intermittent episodes of right upper quadrant and epigastric pain, nausea, bloating, and fatty food intolerance intermittently over the past few months, but now is becoming weekly in nature.  Seen in ER at [facility] last week.  Now just has upset stomach.  Review of Symptoms:  Constitutional:fatigue headsache Eyes:negative Nose/Mouth/Throat:negative Cardiovascular:negative Respiratory:dyspnea Gastrointestinabdominal pain, nausea, heartburn, dyspepsia Genitourinary:negative joint, neck, and back pain Skin:negative Hematolgic/Lymphatic:negative Allergic/Immunologic:negative   Past Medical History:Reviewed  Past Medical History  Surgical History: chiari malformation correction 2017, anal fissurectomy Medical Problems: migraines Allergies: asa, latex, red 40 dye Medications: allegra, flonas, imitrex, pepcid, epipen prn,    Social History:Reviewed  Social History  Preferred Language: English Race:  White Ethnicity: Not Hispanic / Latino Age: 39 year Marital Status:  M Alcohol: socially   Smoking Status: Never smoker reviewed on 08/02/2016 Functional Status reviewed on 08/02/2016 ------------------------------------------------ Bathing: Normal Cooking: Normal Dressing: Normal Driving: Normal Eating: Normal Managing Meds: Normal Oral Care: Normal Shopping: Normal Toileting: Normal Transferring: Normal Walking: Normal Cognitive Status reviewed on 08/02/2016 ------------------------------------------------ Attention: Normal Decision Making: Normal Language: Normal Memory: Normal Motor: Normal Perception:  Normal Problem Solving: Normal Visual and Spatial: Normal   Family History:Reviewed  Family Health History Family History is Unknown    Objective Information: General:Well appearing, well nourished in no distress. Head:Atraumatic; no masses; no abnormalities Neck:Supple without lymphadenopathy.  Heart:RRR, no murmur or gallop.  Normal S1, S2.  No S3, S4.  Lungs:CTA bilaterally, no wheezes, rhonchi, rales.  Breathing unlabored. Breasts: Abdomen:Soft, NT/ND, normal bowel sounds, no HSM, no masses.  No peritoneal signs. Dr. Delanna Ahmadi notes reviewed.  U/S report shows no cholelithiasis, normal common bile duct.  HIDA shows low gallbladder ejection fraction with reproducible symptoms with Ensure drink Assessment:Chronic cholecystitis  Diagnoses: 575.11  K81.1 Chronic cholecystitis without calculus (Chronic cholecystitis)  Procedures: [id number] - OFFICE OUTPATIENT NEW 30 MINUTES    Plan:  Scheduled for laparoscopic cholecystectomy on 08/06/16.   Patient Education:Alternative treatments to surgery were discussed with patient (and family).Risks and benefits  of procedure including bleeding, infection, hepatobiliary injury, and the possibility of an open procediure were fully explained to the patient (and family) who gave informed consent. Patient/family questions were addressed.  Follow-up:Pending Surgery

## 2016-08-06 ENCOUNTER — Ambulatory Visit (HOSPITAL_COMMUNITY): Payer: Medicaid Other | Admitting: Anesthesiology

## 2016-08-06 ENCOUNTER — Encounter (HOSPITAL_COMMUNITY): Payer: Self-pay | Admitting: *Deleted

## 2016-08-06 ENCOUNTER — Encounter (HOSPITAL_COMMUNITY)
Admission: RE | Disposition: A | Discharge status: Home / Self Care | Payer: Self-pay | Source: Ambulatory Visit | Attending: General Surgery

## 2016-08-06 ENCOUNTER — Ambulatory Visit (HOSPITAL_COMMUNITY)
Admission: RE | Admit: 2016-08-06 | Discharge: 2016-08-06 | Disposition: A | Discharge status: Home / Self Care | Payer: Medicaid Other | Source: Ambulatory Visit | Attending: General Surgery | Admitting: General Surgery

## 2016-08-06 DIAGNOSIS — K219 Gastro-esophageal reflux disease without esophagitis: Secondary | ICD-10-CM | POA: Diagnosis not present

## 2016-08-06 DIAGNOSIS — Z7951 Long term (current) use of inhaled steroids: Secondary | ICD-10-CM | POA: Diagnosis not present

## 2016-08-06 DIAGNOSIS — Z87728 Personal history of other specified (corrected) congenital malformations of nervous system and sense organs: Secondary | ICD-10-CM | POA: Diagnosis not present

## 2016-08-06 DIAGNOSIS — K811 Chronic cholecystitis: Secondary | ICD-10-CM | POA: Insufficient documentation

## 2016-08-06 DIAGNOSIS — Z79899 Other long term (current) drug therapy: Secondary | ICD-10-CM | POA: Insufficient documentation

## 2016-08-06 DIAGNOSIS — Z87891 Personal history of nicotine dependence: Secondary | ICD-10-CM | POA: Diagnosis not present

## 2016-08-06 HISTORY — PX: CHOLECYSTECTOMY: SHX55

## 2016-08-06 SURGERY — LAPAROSCOPIC CHOLECYSTECTOMY
Anesthesia: General | Site: Abdomen

## 2016-08-06 MED ORDER — SUGAMMADEX SODIUM 500 MG/5ML IV SOLN
INTRAVENOUS | Status: DC | PRN
  Administered 2016-08-06: 150 mg via INTRAVENOUS

## 2016-08-06 MED ORDER — SUCCINYLCHOLINE CHLORIDE 20 MG/ML IJ SOLN
INTRAMUSCULAR | Status: AC
  Filled 2016-08-06: qty 3

## 2016-08-06 MED ORDER — PROMETHAZINE HCL 50 MG PO TABS: 50.0000 mg | ORAL_TABLET | Freq: Four times a day (QID) | ORAL | 1 refills | Status: DC | PRN

## 2016-08-06 MED ORDER — BACITRACIN ZINC 500 UNIT/GM EX OINT
TOPICAL_OINTMENT | CUTANEOUS | Status: AC
  Filled 2016-08-06: qty 0.9

## 2016-08-06 MED ORDER — HEMOSTATIC AGENTS (NO CHARGE) OPTIME
TOPICAL | Status: DC | PRN
  Administered 2016-08-06: 1 via TOPICAL

## 2016-08-06 MED ORDER — HYDROMORPHONE HCL 1 MG/ML IJ SOLN: 0.2500 mg | INTRAMUSCULAR | Status: DC | PRN

## 2016-08-06 MED ORDER — PROPOFOL 10 MG/ML IV BOLUS
INTRAVENOUS | Status: DC | PRN
  Administered 2016-08-06: 150 mg via INTRAVENOUS

## 2016-08-06 MED ORDER — ROCURONIUM BROMIDE 100 MG/10ML IV SOLN
INTRAVENOUS | Status: DC | PRN
  Administered 2016-08-06: 10 mg via INTRAVENOUS

## 2016-08-06 MED ORDER — MIDAZOLAM HCL 2 MG/2ML IJ SOLN
1.0000 mg | INTRAMUSCULAR | Status: DC | PRN
  Administered 2016-08-06 (×2): 2 mg via INTRAVENOUS
  Filled 2016-08-06 (×2): qty 2

## 2016-08-06 MED ORDER — CHLORHEXIDINE GLUCONATE CLOTH 2 % EX PADS: 6.0000 | MEDICATED_PAD | Freq: Once | CUTANEOUS | Status: DC

## 2016-08-06 MED ORDER — BACITRACIN 500 UNIT/GM EX OINT
TOPICAL_OINTMENT | CUTANEOUS | Status: DC | PRN
  Administered 2016-08-06: 1 via TOPICAL

## 2016-08-06 MED ORDER — CIPROFLOXACIN IN D5W 400 MG/200ML IV SOLN
400.0000 mg | INTRAVENOUS | Status: AC
  Administered 2016-08-06: 400 mg via INTRAVENOUS
  Filled 2016-08-06: qty 200

## 2016-08-06 MED ORDER — LIDOCAINE HCL (PF) 1 % IJ SOLN
INTRAMUSCULAR | Status: AC
  Filled 2016-08-06: qty 2

## 2016-08-06 MED ORDER — SODIUM CHLORIDE 0.9% FLUSH
INTRAVENOUS | Status: AC
Start: 2016-08-06 — End: 2016-08-06
  Filled 2016-08-06: qty 10

## 2016-08-06 MED ORDER — SUGAMMADEX SODIUM 500 MG/5ML IV SOLN
INTRAVENOUS | Status: AC
  Filled 2016-08-06: qty 5

## 2016-08-06 MED ORDER — BUPIVACAINE HCL (PF) 0.5 % IJ SOLN
INTRAMUSCULAR | Status: AC
  Filled 2016-08-06: qty 30

## 2016-08-06 MED ORDER — GLYCOPYRROLATE 0.2 MG/ML IJ SOLN
INTRAMUSCULAR | Status: AC
  Filled 2016-08-06: qty 3

## 2016-08-06 MED ORDER — HYDROMORPHONE HCL 1 MG/ML IJ SOLN
0.2500 mg | INTRAMUSCULAR | Status: DC | PRN
  Administered 2016-08-06 (×5): 0.5 mg via INTRAVENOUS
  Filled 2016-08-06 (×5): qty 0.5

## 2016-08-06 MED ORDER — PROPOFOL 10 MG/ML IV BOLUS
INTRAVENOUS | Status: AC
  Filled 2016-08-06: qty 40

## 2016-08-06 MED ORDER — ONDANSETRON HCL 4 MG/2ML IJ SOLN
4.0000 mg | Freq: Once | INTRAMUSCULAR | Status: AC
  Administered 2016-08-06: 4 mg via INTRAVENOUS
  Filled 2016-08-06: qty 2

## 2016-08-06 MED ORDER — PROMETHAZINE HCL 25 MG/ML IJ SOLN
6.2500 mg | INTRAMUSCULAR | Status: DC | PRN
  Administered 2016-08-06: 6.25 mg via INTRAVENOUS

## 2016-08-06 MED ORDER — LIDOCAINE HCL (PF) 1 % IJ SOLN
INTRAMUSCULAR | Status: AC
  Filled 2016-08-06: qty 5

## 2016-08-06 MED ORDER — ROCURONIUM BROMIDE 50 MG/5ML IV SOLN
INTRAVENOUS | Status: AC
  Filled 2016-08-06: qty 1

## 2016-08-06 MED ORDER — FENTANYL CITRATE (PF) 250 MCG/5ML IJ SOLN
INTRAMUSCULAR | Status: AC
  Filled 2016-08-06: qty 5

## 2016-08-06 MED ORDER — PROMETHAZINE HCL 25 MG/ML IJ SOLN
INTRAMUSCULAR | Status: AC
  Filled 2016-08-06: qty 1

## 2016-08-06 MED ORDER — BUPIVACAINE HCL (PF) 0.5 % IJ SOLN
INTRAMUSCULAR | Status: DC | PRN
  Administered 2016-08-06: 10 mL

## 2016-08-06 MED ORDER — DEXAMETHASONE SODIUM PHOSPHATE 4 MG/ML IJ SOLN
4.0000 mg | Freq: Once | INTRAMUSCULAR | Status: AC
  Administered 2016-08-06: 4 mg via INTRAVENOUS
  Filled 2016-08-06: qty 1

## 2016-08-06 MED ORDER — OXYCODONE-ACETAMINOPHEN 7.5-325 MG PO TABS: 1.0000 | ORAL_TABLET | ORAL | 0 refills | Status: DC | PRN

## 2016-08-06 MED ORDER — NEOSTIGMINE METHYLSULFATE 10 MG/10ML IV SOLN
INTRAVENOUS | Status: AC
  Filled 2016-08-06: qty 2

## 2016-08-06 MED ORDER — LACTATED RINGERS IV SOLN
INTRAVENOUS | Status: DC
  Administered 2016-08-06: 07:00:00 via INTRAVENOUS

## 2016-08-06 MED ORDER — SODIUM CHLORIDE 0.9 % IR SOLN
Status: DC | PRN
  Administered 2016-08-06: 1000 mL

## 2016-08-06 MED ORDER — FENTANYL CITRATE (PF) 100 MCG/2ML IJ SOLN
INTRAMUSCULAR | Status: DC | PRN
  Administered 2016-08-06 (×5): 50 ug via INTRAVENOUS

## 2016-08-06 MED ORDER — SUCCINYLCHOLINE CHLORIDE 20 MG/ML IJ SOLN
INTRAMUSCULAR | Status: AC
  Filled 2016-08-06: qty 1

## 2016-08-06 MED ORDER — SUCCINYLCHOLINE CHLORIDE 20 MG/ML IJ SOLN
INTRAMUSCULAR | Status: DC | PRN
  Administered 2016-08-06: 140 mg via INTRAVENOUS

## 2016-08-06 MED ORDER — LIDOCAINE HCL (CARDIAC) 20 MG/ML IV SOLN
INTRAVENOUS | Status: DC | PRN
  Administered 2016-08-06: 30 mg via INTRAVENOUS

## 2016-08-06 SURGICAL SUPPLY — 41 items
APPLIER CLIP LAPSCP 10X32 DD (CLIP) ×3 IMPLANT
BAG HAMPER (MISCELLANEOUS) ×3 IMPLANT
CHLORAPREP W/TINT 26ML (MISCELLANEOUS) ×3 IMPLANT
CLOTH BEACON ORANGE TIMEOUT ST (SAFETY) ×3 IMPLANT
COVER LIGHT HANDLE STERIS (MISCELLANEOUS) ×6 IMPLANT
DECANTER SPIKE VIAL GLASS SM (MISCELLANEOUS) ×3 IMPLANT
ELECT REM PT RETURN 9FT ADLT (ELECTROSURGICAL) ×3
ELECTRODE REM PT RTRN 9FT ADLT (ELECTROSURGICAL) ×1 IMPLANT
FILTER SMOKE EVAC LAPAROSHD (FILTER) ×3 IMPLANT
FORMALIN 10 PREFIL 120ML (MISCELLANEOUS) ×3 IMPLANT
GLOVE BIOGEL PI IND STRL 7.0 (GLOVE) ×1 IMPLANT
GLOVE BIOGEL PI INDICATOR 7.0 (GLOVE) ×2
GLOVE SURG SS PI 7.5 STRL IVOR (GLOVE) ×3 IMPLANT
GOWN STRL REUS W/ TWL XL LVL3 (GOWN DISPOSABLE) ×1 IMPLANT
GOWN STRL REUS W/TWL LRG LVL3 (GOWN DISPOSABLE) ×6 IMPLANT
GOWN STRL REUS W/TWL XL LVL3 (GOWN DISPOSABLE) ×2
HEMOSTAT SNOW SURGICEL 2X4 (HEMOSTASIS) ×3 IMPLANT
INST SET LAPROSCOPIC AP (KITS) ×3 IMPLANT
IV NS IRRIG 3000ML ARTHROMATIC (IV SOLUTION) IMPLANT
KIT ROOM TURNOVER APOR (KITS) ×3 IMPLANT
MANIFOLD NEPTUNE II (INSTRUMENTS) ×3 IMPLANT
NEEDLE INSUFFLATION 14GA 120MM (NEEDLE) ×3 IMPLANT
NS IRRIG 1000ML POUR BTL (IV SOLUTION) ×3 IMPLANT
PACK LAP CHOLE LZT030E (CUSTOM PROCEDURE TRAY) ×3 IMPLANT
PAD ARMBOARD 7.5X6 YLW CONV (MISCELLANEOUS) ×3 IMPLANT
POUCH SPECIMEN RETRIEVAL 10MM (ENDOMECHANICALS) ×3 IMPLANT
SET BASIN LINEN APH (SET/KITS/TRAYS/PACK) ×3 IMPLANT
SET TUBE IRRIG SUCTION NO TIP (IRRIGATION / IRRIGATOR) IMPLANT
SLEEVE ENDOPATH XCEL 5M (ENDOMECHANICALS) ×3 IMPLANT
SPONGE GAUZE 2X2 8PLY STER LF (GAUZE/BANDAGES/DRESSINGS) ×1
SPONGE GAUZE 2X2 8PLY STRL LF (GAUZE/BANDAGES/DRESSINGS) ×2 IMPLANT
STAPLER VISISTAT (STAPLE) ×3 IMPLANT
SUT VICRYL 0 UR6 27IN ABS (SUTURE) ×3 IMPLANT
TAPE CLOTH SURG 4X10 WHT LF (GAUZE/BANDAGES/DRESSINGS) ×3 IMPLANT
TROCAR ENDO BLADELESS 11MM (ENDOMECHANICALS) ×3 IMPLANT
TROCAR XCEL NON-BLD 5MMX100MML (ENDOMECHANICALS) ×3 IMPLANT
TROCAR XCEL UNIV SLVE 11M 100M (ENDOMECHANICALS) ×3 IMPLANT
TUBE CONNECTING 12'X1/4 (SUCTIONS) ×1
TUBE CONNECTING 12X1/4 (SUCTIONS) ×2 IMPLANT
TUBING INSUFFLATION (TUBING) ×3 IMPLANT
WARMER LAPAROSCOPE (MISCELLANEOUS) ×3 IMPLANT

## 2016-08-06 NOTE — Discharge Instructions (Signed)
Laparoscopic Cholecystectomy, Care After °This sheet gives you information about how to care for yourself after your procedure. Your health care provider may also give you more specific instructions. If you have problems or questions, contact your health care provider. °What can I expect after the procedure? °After the procedure, it is common to have: °· Pain at your incision sites. You will be given medicines to control this pain. °· Mild nausea or vomiting. °· Bloating and possible shoulder pain from the air-like gas that was used during the procedure. °Follow these instructions at home: °Incision care  ° °· Follow instructions from your health care provider about how to take care of your incisions. Make sure you: °¨ Wash your hands with soap and water before you change your bandage (dressing). If soap and water are not available, use hand sanitizer. °¨ Change your dressing as told by your health care provider. °¨ Leave stitches (sutures), skin glue, or adhesive strips in place. These skin closures may need to be in place for 2 weeks or longer. If adhesive strip edges start to loosen and curl up, you may trim the loose edges. Do not remove adhesive strips completely unless your health care provider tells you to do that. °· Do not take baths, swim, or use a hot tub until your health care provider approves. Ask your health care provider if you can take showers. You may only be allowed to take sponge baths for bathing. °· Check your incision area every day for signs of infection. Check for: °¨ More redness, swelling, or pain. °¨ More fluid or blood. °¨ Warmth. °¨ Pus or a bad smell. °Activity  °· Do not drive or use heavy machinery while taking prescription pain medicine. °· Do not lift anything that is heavier than 10 lb (4.5 kg) until your health care provider approves. °· Do not play contact sports until your health care provider approves. °· Do not drive for 24 hours if you were given a medicine to help you relax  (sedative). °· Rest as needed. Do not return to work or school until your health care provider approves. °General instructions  °· Take over-the-counter and prescription medicines only as told by your health care provider. °· To prevent or treat constipation while you are taking prescription pain medicine, your health care provider may recommend that you: °¨ Drink enough fluid to keep your urine clear or pale yellow. °¨ Take over-the-counter or prescription medicines. °¨ Eat foods that are high in fiber, such as fresh fruits and vegetables, whole grains, and beans. °¨ Limit foods that are high in fat and processed sugars, such as fried and sweet foods. °Contact a health care provider if: °· You develop a rash. °· You have more redness, swelling, or pain around your incisions. °· You have more fluid or blood coming from your incisions. °· Your incisions feel warm to the touch. °· You have pus or a bad smell coming from your incisions. °· You have a fever. °· One or more of your incisions breaks open. °Get help right away if: °· You have trouble breathing. °· You have chest pain. °· You have increasing pain in your shoulders. °· You faint or feel dizzy when you stand. °· You have severe pain in your abdomen. °· You have nausea or vomiting that lasts for more than one day. °· You have leg pain. °This information is not intended to replace advice given to you by your health care provider. Make sure you discuss any   questions you have with your health care provider. °Document Released: 07/16/2005 Document Revised: 02/04/2016 Document Reviewed: 01/02/2016 °Elsevier Interactive Patient Education © 2017 [employer] ° °

## 2016-08-06 NOTE — Interval H&P Note (Signed)
History and Physical Interval Note:  08/06/2016 7:18 AM  Melanie Thomas  has presented today for surgery, with the diagnosis of chronic cholecystitis  The various methods of treatment have been discussed with the patient and family. After consideration of risks, benefits and other options for treatment, the patient has consented to  Procedure(s): LAPAROSCOPIC CHOLECYSTECTOMY (N/A) as a surgical intervention .  The patient's history has been reviewed, patient examined, no change in status, stable for surgery.  I have reviewed the patient's chart and labs.  Questions were answered to the patient's satisfaction.     [name] A

## 2016-08-06 NOTE — Op Note (Signed)
Patient:  Melanie Thomas  DOB:  10/20/1985  MRN:  [id number]   Preop Diagnosis:  Chronic cholecystitis  Postop Diagnosis:  Same  Procedure:  Laparoscopic cholecystectomy  Surgeon:  [name], M.D.  Anes:  Gen. endotracheal  Indications:  Patient is a 31 year old white female presents with the right colic secondary to chronic cholecystitis. The risks and benefits of the procedure including bleeding, infection, hepatobiliary injury, and the possibility of an open procedure were fully explained to the patient, who gave informed consent.  Procedure note:  The patient was placed the supine position. After induction of general endotracheal anesthesia, the abdomen was prepped and draped using the usual sterile technique with ChloraPrep. Surgical site confirmation was performed.  An infraumbilical incision was made down to the fascia. A Veress needle was introduced into the abdominal cavity and confirmation of placement was done using the saline drop test. The abdomen was then insufflated to 16 mmHg pressure. An 11 mm trocar was introduced into the abdominal cavity under direct visualization without difficulty. The patient was placed in reverse Trendelenburg position and an additional 1 mm trocar was placed the epigastric region and 5 mm trochars were placed the right upper quadrant and right flank regions. Liver was inspected and noted to be within normal limits. The gallbladder was retracted in a dynamic fashion in order to provide a critical view of the triangle of Calot. The cystic duct was first identified. Its juncture to the infundibulum was fully identified. Endoclips placed proximally and distally on the cystic duct, and the cystic duct was divided and this is likewise done to the cystic artery. The gallbladder was freed away from the gallbladder fossa using Bovie electrocautery. The gallbladder was delivered through the epigastric trocar site using an Endo Catch bag. The gallbladder  fossa was inspected and no abnormal bleeding or bile leakage was noted. Surgicel was placed the gallbladder fossa. All fluid and air were then evacuated from the abdominal cavity prior to removal of the trochars.  All wounds were irrigated with normal saline. All wounds were injected with 0.5% Sensorcaine. The infraumbilical fascia was reapproximated using an 0 Vicryl interrupted suture. All skin incisions were closed using staples. Triple antibiotic ointment and dry sterile dressings were applied.  All tape and needle counts were correct at the end of the procedure. Patient was extubated in the operating room and transferred to PACU in stable condition.  Complications:  None  EBL:  Minimal  Specimen:  Gallbladder

## 2016-08-06 NOTE — Transfer of Care (Signed)
Immediate Anesthesia Transfer of Care Note  Patient: Melanie Thomas  Procedure(s) Performed: Procedure(s): LAPAROSCOPIC CHOLECYSTECTOMY (N/A)  Patient Location: PACU  Anesthesia Type:General  Level of Consciousness: awake, alert , oriented and patient cooperative  Airway & Oxygen Therapy: Patient Spontanous Breathing and Patient connected to face mask oxygen  Post-op Assessment: Report given to RN and Post -op Vital signs reviewed and stable  Post vital signs: Reviewed and stable  Last Vitals:  Vitals:   08/06/16 0740 08/06/16 0741  BP:    Pulse:    Resp: (!) 21 (!) 25  Temp:      Last Pain:  Vitals:   08/06/16 0702  TempSrc: Oral      Patients Stated Pain Goal: 8 ([phone] [id number])  Complications: No apparent anesthesia complications

## 2016-08-06 NOTE — Anesthesia Preprocedure Evaluation (Signed)
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    GI/Hepatic GERD  Medicated and Controlled,  Endo/Other    Renal/GU      Musculoskeletal   Abdominal   Peds  Hematology   Anesthesia Other Findings   Reproductive/Obstetrics                             Anesthesia Physical Anesthesia Plan  ASA: II  Anesthesia Plan: General   Post-op Pain Management:    Induction: Intravenous, Rapid sequence and Cricoid pressure planned  Airway Management Planned: Oral ETT  Additional Equipment:   Intra-op Plan:   Post-operative Plan: Extubation in OR  Informed Consent: I have reviewed the patients History and Physical, chart, labs and discussed the procedure including the risks, benefits and alternatives for the proposed anesthesia with the patient or authorized representative who has indicated his/her understanding and acceptance.     Plan Discussed with:   Anesthesia Plan Comments:         Anesthesia Quick Evaluation

## 2016-08-06 NOTE — Anesthesia Postprocedure Evaluation (Signed)
Anesthesia Post Note  Patient: Melanie Thomas  Procedure(s) Performed: Procedure(s) (LRB): LAPAROSCOPIC CHOLECYSTECTOMY (N/A)  Patient location during evaluation: PACU Anesthesia Type: General Level of consciousness: awake and alert and oriented Pain management: pain level controlled Vital Signs Assessment: post-procedure vital signs reviewed and stable Respiratory status: spontaneous breathing Cardiovascular status: stable : Nuasea relieved with zofran. Anesthetic complications: no     Last Vitals:  Vitals:   08/06/16 0900 08/06/16 0906  BP: 129/79   Pulse: 80 79  Resp: 17 (!) 21  Temp:      Last Pain:  Vitals:   08/06/16 0906  TempSrc:   PainSc: 8                  ADAMS, AMY A

## 2016-08-06 NOTE — Anesthesia Procedure Notes (Signed)
Procedure Name: Intubation Date/Time: 08/06/2016 7:52 AM Performed by: Andree Elk, AMY A Pre-anesthesia Checklist: Patient identified, Timeout performed, Emergency Drugs available, Suction available and Patient being monitored Patient Re-evaluated:Patient Re-evaluated prior to inductionOxygen Delivery Method: Circle System Utilized Preoxygenation: Pre-oxygenation with 100% oxygen Intubation Type: IV induction, Rapid sequence and Cricoid Pressure applied Laryngoscope Size: Miller and 3 Grade View: Grade I Tube type: Oral Tube size: 7.0 mm Number of attempts: 1 Airway Equipment and Method: Stylet Placement Confirmation: ETT inserted through vocal cords under direct vision,  positive ETCO2 and breath sounds checked- equal and bilateral Secured at: 21 cm Tube secured with: Tape Dental Injury: Teeth and Oropharynx as per pre-operative assessment

## 2016-08-07 ENCOUNTER — Encounter (HOSPITAL_COMMUNITY): Payer: Self-pay | Admitting: General Surgery

## 2018-01-28 ENCOUNTER — Ambulatory Visit (HOSPITAL_COMMUNITY)
Admission: RE | Admit: 2018-01-28 | Discharge: 2018-01-28 | Disposition: A | Discharge status: Home / Self Care | Payer: Managed Care, Other (non HMO) | Source: Ambulatory Visit | Attending: Internal Medicine | Admitting: Internal Medicine

## 2018-01-28 ENCOUNTER — Other Ambulatory Visit (HOSPITAL_COMMUNITY): Payer: Self-pay | Admitting: Internal Medicine

## 2018-01-28 DIAGNOSIS — M7731 Calcaneal spur, right foot: Secondary | ICD-10-CM | POA: Insufficient documentation

## 2018-01-28 DIAGNOSIS — X58XXXA Exposure to other specified factors, initial encounter: Secondary | ICD-10-CM | POA: Diagnosis not present

## 2018-01-28 DIAGNOSIS — M79671 Pain in right foot: Secondary | ICD-10-CM

## 2018-01-28 DIAGNOSIS — S99821A Other specified injuries of right foot, initial encounter: Secondary | ICD-10-CM | POA: Diagnosis present

## 2019-04-08 DIAGNOSIS — R202 Paresthesia of skin: Secondary | ICD-10-CM | POA: Insufficient documentation

## 2019-04-08 DIAGNOSIS — R42 Dizziness and giddiness: Secondary | ICD-10-CM | POA: Insufficient documentation

## 2019-04-08 DIAGNOSIS — R200 Anesthesia of skin: Secondary | ICD-10-CM | POA: Insufficient documentation

## 2019-04-08 DIAGNOSIS — R2689 Other abnormalities of gait and mobility: Secondary | ICD-10-CM | POA: Insufficient documentation

## 2019-04-08 DIAGNOSIS — G4484 Primary exertional headache: Secondary | ICD-10-CM | POA: Insufficient documentation

## 2019-05-27 ENCOUNTER — Other Ambulatory Visit: Payer: Self-pay

## 2019-05-27 DIAGNOSIS — Z20822 Contact with and (suspected) exposure to covid-19: Secondary | ICD-10-CM

## 2019-05-28 LAB — NOVEL CORONAVIRUS, NAA: SARS-CoV-2, NAA: NOT DETECTED

## 2019-06-02 ENCOUNTER — Telehealth: Payer: Self-pay | Admitting: Family Medicine

## 2019-06-02 NOTE — Telephone Encounter (Signed)
Negative COVID results given. Patient results "NOT Detected." Caller expressed understanding. ° °

## 2019-06-17 ENCOUNTER — Other Ambulatory Visit: Payer: Self-pay

## 2019-06-17 DIAGNOSIS — Z20822 Contact with and (suspected) exposure to covid-19: Secondary | ICD-10-CM

## 2019-06-19 ENCOUNTER — Other Ambulatory Visit: Payer: Self-pay

## 2019-06-19 ENCOUNTER — Other Ambulatory Visit (HOSPITAL_COMMUNITY): Payer: Self-pay | Admitting: Internal Medicine

## 2019-06-19 ENCOUNTER — Ambulatory Visit (HOSPITAL_COMMUNITY)
Admission: RE | Admit: 2019-06-19 | Discharge: 2019-06-19 | Disposition: A | Discharge status: Home / Self Care | Payer: Managed Care, Other (non HMO) | Source: Ambulatory Visit | Attending: Internal Medicine | Admitting: Internal Medicine

## 2019-06-19 DIAGNOSIS — R079 Chest pain, unspecified: Secondary | ICD-10-CM | POA: Insufficient documentation

## 2019-06-19 DIAGNOSIS — Z1231 Encounter for screening mammogram for malignant neoplasm of breast: Secondary | ICD-10-CM | POA: Insufficient documentation

## 2019-06-19 LAB — NOVEL CORONAVIRUS, NAA: SARS-CoV-2, NAA: DETECTED — AB

## 2019-07-28 ENCOUNTER — Ambulatory Visit (INDEPENDENT_AMBULATORY_CARE_PROVIDER_SITE_OTHER): Payer: Managed Care, Other (non HMO) | Admitting: Podiatry

## 2019-07-28 ENCOUNTER — Encounter: Payer: Self-pay | Admitting: Podiatry

## 2019-07-28 ENCOUNTER — Other Ambulatory Visit: Payer: Self-pay | Admitting: Podiatry

## 2019-07-28 ENCOUNTER — Other Ambulatory Visit: Payer: Self-pay

## 2019-07-28 ENCOUNTER — Ambulatory Visit (INDEPENDENT_AMBULATORY_CARE_PROVIDER_SITE_OTHER): Payer: Managed Care, Other (non HMO)

## 2019-07-28 VITALS — BP 118/75 | HR 104 | Resp 16

## 2019-07-28 DIAGNOSIS — G5791 Unspecified mononeuropathy of right lower limb: Secondary | ICD-10-CM

## 2019-07-28 DIAGNOSIS — M778 Other enthesopathies, not elsewhere classified: Secondary | ICD-10-CM
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
## 2019-09-08 ENCOUNTER — Other Ambulatory Visit: Payer: Self-pay

## 2019-09-08 ENCOUNTER — Encounter: Payer: Self-pay | Admitting: Podiatry

## 2019-09-08 ENCOUNTER — Ambulatory Visit (INDEPENDENT_AMBULATORY_CARE_PROVIDER_SITE_OTHER): Payer: Managed Care, Other (non HMO) | Admitting: Podiatry

## 2019-09-08 DIAGNOSIS — G5791 Unspecified mononeuropathy of right lower limb: Secondary | ICD-10-CM

## 2019-09-08 NOTE — Progress Notes (Signed)
She presents today for follow-up of neuritis right foot.  States this not quite as bad as it was but only about 10% better maybe states that driving makes it hurt a whole lot worse she states that it got aggravated in the same area as she refers to the dorsal aspect of the right foot.  Objective: Vital signs are stable she alert oriented x3 has pain on palpation of the dorsal medial cutaneous nerve as well as the deep peroneal nerve as it courses into the first intermetatarsal space.  She also has a dorsal exostosis.  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  Plan: We will send her for nerve conduction velocity exam to make sure that it is just not a entrapment but may be something that we can decompress as far as the bone spur.  I will follow-up with her once those results are in.

## 2019-09-10 ENCOUNTER — Other Ambulatory Visit: Payer: Self-pay

## 2019-09-10 DIAGNOSIS — G5791 Unspecified mononeuropathy of right lower limb: Secondary | ICD-10-CM

## 2019-09-14 ENCOUNTER — Telehealth: Payer: Self-pay

## 2019-09-14 NOTE — Telephone Encounter (Signed)
Pt. LVm requesting to know an update on her Nerve study test.  I called and LVM to pt explaining that her test will be done at [facility] and they will be contacting her with her appointment information. I advised the Pt it may take about 1 whole week or more due to covid. I advised the Pt to return our phone call if any other questions

## 2019-09-21 ENCOUNTER — Telehealth: Payer: Self-pay | Admitting: *Deleted

## 2019-09-21 NOTE — Telephone Encounter (Signed)
Called and left a message for the patient. Melanie Thomas

## 2019-09-21 NOTE — Addendum Note (Signed)
Addended by: Viviana Simpler on: 09/21/2019 08:34 AM   Modules accepted: Orders

## 2019-09-22 ENCOUNTER — Other Ambulatory Visit: Payer: Self-pay

## 2019-09-22 ENCOUNTER — Ambulatory Visit (INDEPENDENT_AMBULATORY_CARE_PROVIDER_SITE_OTHER): Payer: Medicaid Other | Admitting: Neurology

## 2019-09-22 DIAGNOSIS — G5791 Unspecified mononeuropathy of right lower limb: Secondary | ICD-10-CM

## 2019-09-22 NOTE — Procedures (Signed)
[facility]  [address], [place]  [place], [place] [address] Tel: [phone] Fax:  [phone] Test Date:  09/22/2019  Patient: Melanie Thomas DOB: [birth year] Physician: Narda Amber, DO  Sex: Female Height: 5\' 3"  Ref Phys: Tyson Dense, DPM  ID#: [id number] Temp: 33.0C Technician:    Patient Complaints: This is a 34 year old female referred for evaluation of right foot pain to evaluate for deep peroneal neuropathy.  NCV & EMG Findings: Extensive electrodiagnostic testing of the right lower extremity and additional studies of the left shows:  1. Bilateral superficial peroneal sensory responses are symmetric and within normal limits.  Right sural sensory responses within normal limits. 2. Bilateral peroneal and right tibial motor responses are within normal limits. 3. Right tibial H reflex study is within normal limits. 4. There is no evidence of active or chronic motor axonal loss changes affecting any of the tested muscles.  Motor unit configuration and recruitment pattern is within normal limits.  Impression: This is a normal study of the right lower extremity.  In particular, there is no evidence of a peroneal mononeuropathy, lumbosacral radiculopathy, or sensorimotor polyneuropathy.   ___________________________ Narda Amber, DO    Nerve Conduction Studies Anti Sensory Summary Table   Site NR Peak (ms) Norm Peak (ms) P-T Amp (V) Norm P-T Amp  Left Sup Peroneal Anti Sensory (Ant Lat Mall)  33C  12 cm    2.2 <4.5 9.2 >5  Right Sup Peroneal Anti Sensory (Ant Lat Mall)  33C  12 cm    2.2 <4.5 9.5 >5  Right Sural Anti Sensory (Lat Mall)  33C  Calf    2.6 <4.5 21.8 >5   Motor Summary Table   Site NR Onset (ms) Norm Onset (ms) O-P Amp (mV) Norm O-P Amp Site1 Site2 Delta-0 (ms) Dist (cm) Vel (m/s) Norm Vel (m/s)  Left Peroneal Motor (Ext Dig Brev)  33C  Ankle    3.8 <5.5 5.3 >3 B Fib Ankle 7.3 35.0 48 >40  B Fib    11.1  5.2  Poplt B Fib 1.2 7.0 58  >40  Poplt    12.3  4.1         Right Peroneal Motor (Ext Dig Brev)  33C  Ankle    3.7 <5.5 4.0 >3 B Fib Ankle 6.8 38.0 56 >40  B Fib    10.5  3.0  Poplt B Fib 1.5 8.0 53 >40  Poplt    12.0  3.1         Left Peroneal TA Motor (Tib Ant)  33C  Fib Head    2.4 <4.0 5.4 >4 Poplit Fib Head 1.2 7.0 58 >40  Poplit    3.6  5.2         Right Peroneal TA Motor (Tib Ant)  33C  Fib Head    1.7 <4.0 5.7 >4 Poplit Fib Head 1.8 10.0 56 >40  Poplit    3.5  5.3         Right Tibial Motor (Abd Hall Brev)  33C    body habitus behind knee  Ankle    2.8 <6.0 17.6 >8 Knee Ankle 7.7 44.0 57 >40  Knee    10.5  5.8          H Reflex Studies   NR H-Lat (ms) Lat Norm (ms) L-R H-Lat (ms)  Right Tibial (Gastroc)  33C     27.48 <35    EMG   Side Muscle Ins Act Fibs Psw  Fasc Number Recrt Dur Dur. Amp Amp. Poly Poly. Comment  Right AntTibialis Nml Nml Nml Nml Nml Nml Nml Nml Nml Nml Nml Nml N/A  Right ExtHallLong Nml Nml Nml Nml Nml Nml Nml Nml Nml Nml Nml Nml N/A  Right Fibularis Long Nml Nml Nml Nml Nml Nml Nml Nml Nml Nml Nml Nml N/A  Right Gastroc Nml Nml Nml Nml Nml Nml Nml Nml Nml Nml Nml Nml N/A  Right Flex Dig Long Nml Nml Nml Nml Nml Nml Nml Nml Nml Nml Nml Nml N/A  Right RectFemoris Nml Nml Nml Nml Nml Nml Nml Nml Nml Nml Nml Nml N/A      Waveforms:

## 2019-09-23 ENCOUNTER — Telehealth: Payer: Self-pay | Admitting: *Deleted

## 2019-09-23 NOTE — Telephone Encounter (Addendum)
-----   Message from Garrel Ridgel, [place] sent at 09/23/2019  7:06 AM EST ----- Normal nerve conduction velocity exam and EMG.  She can follow-up with [place] for further treatment options  ----- Message ----- From: Alda Berthold, DO Sent: 09/22/2019  12:45 PM EST To: Garrel Ridgel, DPM

## 2019-09-23 NOTE — Telephone Encounter (Signed)
Pt called for results.

## 2019-09-23 NOTE — Telephone Encounter (Signed)
Left message informing pt Dr. Milinda Pointer had stated the testing was normal and to schedule with our office to discuss further treatment options.

## 2019-09-23 NOTE — Progress Notes (Signed)
Normal nerve conduction velocity exam and EMG.  She can follow-up with [place] for further treatment options.

## 2019-09-23 NOTE — Telephone Encounter (Signed)
Left message for pt to call for results  

## 2019-10-02 ENCOUNTER — Other Ambulatory Visit: Payer: Self-pay

## 2019-10-02 ENCOUNTER — Ambulatory Visit (INDEPENDENT_AMBULATORY_CARE_PROVIDER_SITE_OTHER): Payer: Managed Care, Other (non HMO) | Admitting: Podiatry

## 2019-10-02 DIAGNOSIS — M19071 Primary osteoarthritis, right ankle and foot: Secondary | ICD-10-CM | POA: Diagnosis not present

## 2019-10-02 DIAGNOSIS — G5791 Unspecified mononeuropathy of right lower limb: Secondary | ICD-10-CM

## 2019-10-02 DIAGNOSIS — M19079 Primary osteoarthritis, unspecified ankle and foot: Secondary | ICD-10-CM

## 2019-10-06 ENCOUNTER — Encounter: Payer: Self-pay | Admitting: Podiatry

## 2019-10-06 NOTE — Progress Notes (Signed)
Subjective:  Patient ID: Melanie Thomas, female    DOB: 1986-06-08,  MRN: [id number]  No chief complaint on file.   34 y.o. female presents with the above complaint.  Patient presents with a right dorsal midfoot arthritis with associated nerve impingement.  Patient was seen by Dr. Milinda Pointer and is well-known to him.  Patient was receiving injection to diagnose neuritis.  She states that her pain did improve a little bit and gave her relief for about a month or so.  Patient would like to know if she can receive another injection as it appears that the injection is definitely giving her relief.  Patient had the nerve conduction study done which was normal.  Dr. Milinda Pointer was planning a surgical intervention to decrease/decompress the bone spurring and therefore reduce the entrapment or pressure on the nerve.   Review of Systems: Negative except as noted in the HPI. Denies N/V/F/Ch.  Past Medical History:  Diagnosis Date  . Chiari malformation   . GERD (gastroesophageal reflux disease)   . History of angioedema   . PONV (postoperative nausea and vomiting)     Current Outpatient Medications:  .  cetirizine (ZYRTEC) 10 MG tablet, Take 10 mg by mouth daily., Disp: , Rfl:  .  cyclobenzaprine (FLEXERIL) 10 MG tablet, Take 10 mg by mouth daily as needed for muscle spasms., Disp: , Rfl:  .  EPINEPHrine (EPIPEN JR) 0.15 MG/0.3ML injection, Inject 0.15 mg into the muscle as needed for anaphylaxis., Disp: , Rfl:  .  famotidine (PEPCID AC) 10 MG chewable tablet, Chew 10 mg by mouth daily as needed for heartburn., Disp: , Rfl:  .  levothyroxine (SYNTHROID, LEVOTHROID) 75 MCG tablet, Take 75 mcg by mouth daily before breakfast., Disp: , Rfl:  .  naproxen sodium (ANAPROX) 220 MG tablet, Take 440 mg by mouth every 12 (twelve) hours as needed (pain)., Disp: , Rfl:  .  promethazine (PHENERGAN) 25 MG tablet, Take 25 mg by mouth every 6 (six) hours as needed for nausea/vomiting., Disp: , Rfl:  .  promethazine  (PHENERGAN) 50 MG tablet, Take 1 tablet (50 mg total) by mouth every 6 (six) hours as needed for nausea or vomiting., Disp: 30 tablet, Rfl: 1  Social History   Tobacco Use  Smoking Status Former Smoker  . Packs/day: 0.50  . Years: 2.00  . Pack years: 1.00  . Types: Cigarettes  . Quit date: 08/03/2007  . Years since quitting: 12.1  Smokeless Tobacco Never Used    Allergies  Allergen Reactions  . Aspirin Anaphylaxis, Rash and Other (See Comments)    REACTION: Blisters. Pt reports angioedema.  . Aspirin-Acetaminophen-Caffeine Anaphylaxis  . Shrimp [Shellfish Allergy] Anaphylaxis, Rash and Other (See Comments)    REACTION: Blisters. Pt reports angioedema.  . Verapamil Anaphylaxis  . Excedrin Tension Headache [Acetaminophen-Caffeine] Swelling    Pt reports angioedema.  . Ibuprofen Swelling    Pt reports angioedema. Can take aleve occassionally  . Other     Clorox, certain shampoo, soaps, trees, mold, red dye  . Gabapentin Swelling and Hives    Per patient "swelling around throat"  . Ketorolac Rash  . Latex Swelling and Rash    Pt reports angioedema.   Objective:  There were no vitals filed for this visit. There is no height or weight on file to calculate BMI. Constitutional Well developed. Well nourished.  Vascular Dorsalis pedis pulses palpable bilaterally. Posterior tibial pulses palpable bilaterally. Capillary refill normal to all digits.  No cyanosis or clubbing noted.  Pedal hair growth normal.  Neurologic Normal speech. Oriented to person, place, and time. Epicritic sensation to light touch grossly present bilaterally.  Dermatologic Nails well groomed and normal in appearance. No open wounds. No skin lesions.  Orthopedic:  Positive Tinel's sign with entrapment of the peroneal nerve with possible exostosis.  Pain on palpation to the dorsal midfoot.   Radiographs: None Assessment:   1. Arthritis of midfoot   2. Neuritis of right foot    Plan:  Patient was  evaluated and treated and all questions answered.  Right dorsal midfoot spurring secondary to arthritis with associated peroneal nerve entrapment -I explained to the patient the etiology of spurring with this relationship with the entrapment of the peroneal nerve that runs on the top of her foot.  I explained to her that given she had normal nerve conduction study I believe the likely culprit is probably the exostosis.  However I believe further surgical management with Dr. Milinda Pointer as he has been treating her primarily.  I believe that patient will benefit from an injection to the area to help decrease some of the pain associated with it.  Patient agrees with the plan would like to proceed with injection. -A steroid injection was performed at right dorsal midfoot using 1% plain Lidocaine and 10 mg of Kenalog. This was well tolerated.   No follow-ups on file.

## 2019-11-05 ENCOUNTER — Ambulatory Visit: Payer: Managed Care, Other (non HMO) | Admitting: Podiatry

## 2020-09-05 ENCOUNTER — Other Ambulatory Visit: Payer: Self-pay | Admitting: Podiatry

## 2020-09-05 DIAGNOSIS — G5791 Unspecified mononeuropathy of right lower limb: Secondary | ICD-10-CM

## 2020-11-06 ENCOUNTER — Emergency Department (HOSPITAL_COMMUNITY): Payer: Medicaid Other

## 2020-11-06 ENCOUNTER — Emergency Department (HOSPITAL_COMMUNITY)
Admission: EM | Admit: 2020-11-06 | Discharge: 2020-11-06 | Disposition: A | Discharge status: Home / Self Care | Payer: Medicaid Other | Attending: Emergency Medicine | Admitting: Emergency Medicine

## 2020-11-06 ENCOUNTER — Encounter (HOSPITAL_COMMUNITY): Payer: Self-pay

## 2020-11-06 ENCOUNTER — Other Ambulatory Visit: Payer: Self-pay

## 2020-11-06 DIAGNOSIS — F444 Conversion disorder with motor symptom or deficit: Secondary | ICD-10-CM | POA: Diagnosis not present

## 2020-11-06 DIAGNOSIS — Z87891 Personal history of nicotine dependence: Secondary | ICD-10-CM | POA: Diagnosis not present

## 2020-11-06 DIAGNOSIS — Z8669 Personal history of other diseases of the nervous system and sense organs: Secondary | ICD-10-CM | POA: Diagnosis not present

## 2020-11-06 DIAGNOSIS — Z20822 Contact with and (suspected) exposure to covid-19: Secondary | ICD-10-CM | POA: Insufficient documentation

## 2020-11-06 DIAGNOSIS — W19XXXA Unspecified fall, initial encounter: Secondary | ICD-10-CM

## 2020-11-06 DIAGNOSIS — Z79899 Other long term (current) drug therapy: Secondary | ICD-10-CM | POA: Diagnosis not present

## 2020-11-06 DIAGNOSIS — G43109 Migraine with aura, not intractable, without status migrainosus: Secondary | ICD-10-CM

## 2020-11-06 DIAGNOSIS — G43809 Other migraine, not intractable, without status migrainosus: Secondary | ICD-10-CM | POA: Insufficient documentation

## 2020-11-06 DIAGNOSIS — I639 Cerebral infarction, unspecified: Secondary | ICD-10-CM | POA: Insufficient documentation

## 2020-11-06 DIAGNOSIS — Z91040 Latex allergy status: Secondary | ICD-10-CM | POA: Insufficient documentation

## 2020-11-06 DIAGNOSIS — R519 Headache, unspecified: Secondary | ICD-10-CM | POA: Diagnosis present

## 2020-11-06 LAB — COMPREHENSIVE METABOLIC PANEL
ALT: 22 U/L (ref 0–44)
AST: 22 U/L (ref 15–41)
Albumin: 4 g/dL (ref 3.5–5.0)
Alkaline Phosphatase: 77 U/L (ref 38–126)
Anion gap: 11 (ref 5–15)
BUN: 11 mg/dL (ref 6–20)
CO2: 23 mmol/L (ref 22–32)
Calcium: 9.5 mg/dL (ref 8.9–10.3)
Chloride: 104 mmol/L (ref 98–111)
Creatinine, Ser: 0.77 mg/dL (ref 0.44–1.00)
GFR, Estimated: >60 mL/min (ref >60)
Glucose, Bld: 113 mg/dL — ABNORMAL HIGH (ref 70–99)
Potassium: 3.8 mmol/L (ref 3.5–5.1)
Sodium: 138 mmol/L (ref 135–145)
Total Bilirubin: 0.4 mg/dL (ref 0.3–1.2)
Total Protein: 7.9 g/dL (ref 6.5–8.1)

## 2020-11-06 LAB — RAPID URINE DRUG SCREEN, HOSP PERFORMED
Amphetamines: NOT DETECTED
Barbiturates: NOT DETECTED
Benzodiazepines: NOT DETECTED
Cocaine: NOT DETECTED
Opiates: NOT DETECTED
Tetrahydrocannabinol: NOT DETECTED

## 2020-11-06 LAB — DIFFERENTIAL
Abs Immature Granulocytes: 0.09 10*3/uL — ABNORMAL HIGH (ref 0.00–0.07)
Basophils Absolute: 0.1 10*3/uL (ref 0.0–0.1)
Basophils Relative: 0 %
Eosinophils Absolute: 0.1 10*3/uL (ref 0.0–0.5)
Eosinophils Relative: 1 %
Immature Granulocytes: 1 %
Lymphocytes Relative: 14 %
Lymphs Abs: 1.8 10*3/uL (ref 0.7–4.0)
Monocytes Absolute: 0.6 10*3/uL (ref 0.1–1.0)
Monocytes Relative: 5 %
Neutro Abs: 10.4 10*3/uL — ABNORMAL HIGH (ref 1.7–7.7)
Neutrophils Relative %: 79 %

## 2020-11-06 LAB — URINALYSIS, ROUTINE W REFLEX MICROSCOPIC
Bilirubin Urine: NEGATIVE
Glucose, UA: NEGATIVE mg/dL
Hgb urine dipstick: NEGATIVE
Ketones, ur: NEGATIVE mg/dL
Leukocytes,Ua: NEGATIVE
Nitrite: NEGATIVE
Protein, ur: NEGATIVE mg/dL
Specific Gravity, Urine: 1.025 (ref 1.005–1.030)
pH: 5 (ref 5.0–8.0)

## 2020-11-06 LAB — I-STAT CHEM 8, ED
BUN: 13 mg/dL (ref 6–20)
Calcium, Ion: 1.18 mmol/L (ref 1.15–1.40)
Chloride: 103 mmol/L (ref 98–111)
Creatinine, Ser: 0.9 mg/dL (ref 0.44–1.00)
Glucose, Bld: 109 mg/dL — ABNORMAL HIGH (ref 70–99)
HCT: 45 % (ref 36.0–46.0)
Hemoglobin: 15.3 g/dL — ABNORMAL HIGH (ref 12.0–15.0)
Potassium: 3.9 mmol/L (ref 3.5–5.1)
Sodium: 140 mmol/L (ref 135–145)
TCO2: 24 mmol/L (ref 22–32)

## 2020-11-06 LAB — CBC
HCT: 43.7 % (ref 36.0–46.0)
Hemoglobin: 14.7 g/dL (ref 12.0–15.0)
MCH: 29.3 pg (ref 26.0–34.0)
MCHC: 33.6 g/dL (ref 30.0–36.0)
MCV: 87.1 fL (ref 80.0–100.0)
Platelets: 347 10*3/uL (ref 150–400)
RBC: 5.02 MIL/uL (ref 3.87–5.11)
RDW: 12.1 % (ref 11.5–15.5)
WBC: 13.1 10*3/uL — ABNORMAL HIGH (ref 4.0–10.5)
nRBC: 0 % (ref 0.0–0.2)

## 2020-11-06 LAB — RESP PANEL BY RT-PCR (FLU A&B, COVID) ARPGX2
Influenza A by PCR: NEGATIVE
Influenza B by PCR: NEGATIVE
SARS Coronavirus 2 by RT PCR: NEGATIVE

## 2020-11-06 LAB — PROTIME-INR
INR: 1 (ref 0.8–1.2)
Prothrombin Time: 12.9 seconds (ref 11.4–15.2)

## 2020-11-06 LAB — I-STAT BETA HCG BLOOD, ED (MC, WL, AP ONLY): I-stat hCG, quantitative: <5 m[IU]/mL (ref <5)

## 2020-11-06 LAB — APTT: aPTT: 27 seconds (ref 24–36)

## 2020-11-06 LAB — ETHANOL: Alcohol, Ethyl (B): 152 mg/dL — ABNORMAL HIGH (ref <10)

## 2020-11-06 MED ORDER — SODIUM CHLORIDE 0.9 % IV BOLUS
1000.0000 mL | Freq: Once | INTRAVENOUS | Status: AC
  Administered 2020-11-06: 1000 mL via INTRAVENOUS

## 2020-11-06 MED ORDER — PROCHLORPERAZINE EDISYLATE 10 MG/2ML IJ SOLN
10.0000 mg | Freq: Four times a day (QID) | INTRAMUSCULAR | Status: DC
  Administered 2020-11-06: 10 mg via INTRAVENOUS
  Filled 2020-11-06: qty 2

## 2020-11-06 MED ORDER — MAGNESIUM SULFATE 2 GM/50ML IV SOLN
2.0000 g | Freq: Once | INTRAVENOUS | Status: AC
  Administered 2020-11-06: 2 g via INTRAVENOUS
  Filled 2020-11-06: qty 50

## 2020-11-06 MED ORDER — SODIUM CHLORIDE 0.9 % IV SOLN
25.0000 mg | Freq: Four times a day (QID) | INTRAVENOUS | Status: DC
  Filled 2020-11-06 (×4): qty 0.5

## 2020-11-06 MED ORDER — IOHEXOL 350 MG/ML SOLN
60.0000 mL | Freq: Once | INTRAVENOUS | Status: AC | PRN
  Administered 2020-11-06: 60 mL via INTRAVENOUS

## 2020-11-06 NOTE — Consult Note (Signed)
Neurology Consultation Reason for Consult: Code stroke for left-sided weakness Requesting Physician: Addison Lank  CC: Headache and left-sided weakness  History is obtained from: Patient and chart review  HPI: Melanie Thomas is a 35 y.o. 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  Today she reports headache, with symptoms beginning around 8 PM including bilateral upper extremity weakness, left side greater than the right side, numbness, emesis, and a fall.  She has been drinking, "3 shots and 1 drink."  On EMS evaluation she had some left-sided weakness for which code stroke was activated, there was also concern for possible right-sided facial droop.  Blood pressures were in the 1 teens over 90s and heart rates were in the 90s.  She did have emesis with EMS for which she was given Zofran with improvement.  She reports that since her surgery she has not had recurrent full left hemibody numbness, with the left-sided numbness she experiences typically limited to just her fingers or her toes.  She reports that she has "strokes" or "TIAs" about 3 times a month currently with left-sided symptoms not always associated with headache  LKW: 8 PM tPA given?: No, due to out of the window Premorbid modified rankin scale:    1 - No significant disability. Able to carry out all usual activities, despite some symptoms.  ROS: All other review of systems was negative except as noted in the HPI.   Past Medical History:  Diagnosis Date  . Chiari malformation   . GERD (gastroesophageal reflux disease)   . History of angioedema   . PONV (postoperative nausea and vomiting)    Past Surgical History:  Procedure Laterality Date  . ANAL FISSURE REPAIR N/A 11/21/2012    Procedure: ANAL FISSURECTOMY;  Surgeon: Jamesetta So, MD;  Location: [facility];  Service: General;  Laterality: N/A;  . CESAREAN SECTION  2006   [facility]  . CHOLECYSTECTOMY N/A 08/06/2016   Procedure: LAPAROSCOPIC CHOLECYSTECTOMY;  Surgeon: [name], MD;  Location: [facility];  Service: General;  Laterality: N/A;  . CRANIECTOMY SUBOCCIPITAL W/ CERVICAL LAMINECTOMY / CHIARI Bilateral 02/29/2016   [facility]  2004, 2009   Current Outpatient Medications  Medication Instructions  . cetirizine (ZYRTEC) 10 mg, Oral, Daily  . cyclobenzaprine (FLEXERIL) 10 mg, Oral, Daily PRN  . EPINEPHrine (EPIPEN JR) 0.15 mg, As needed  . famotidine (PEPCID AC) 10 mg, Oral, Daily PRN  . levothyroxine (SYNTHROID) 75 mcg, Oral, Daily before breakfast  . naproxen sodium (ALEVE) 440 mg, Oral, Every 12 hours PRN  . promethazine (PHENERGAN) 25 mg, Oral, Every 6 hours PRN  . promethazine (PHENERGAN) 50 mg, Oral, Every 6 hours PRN     Family History  Problem Relation Age of Onset  . Hypothyroidism Other   . Hypertension Other   . Diabetes Other   . Breast cancer Other   . Heart disease Other   . Hypertension Other   . Diabetes Other   . Hypertension Other   . Diabetes Other   . Breast cancer Other   . Hypertension Other   . Diabetes Other   . Hypothyroidism Mother     Social History:  reports that she quit smoking about 13 years ago. Her smoking use included cigarettes. She has a 1.00 pack-year smoking history.  She has never used smokeless tobacco. She reports current alcohol use. She reports that she does not use drugs.   Exam: Current vital signs: There were no vitals taken for this visit. Vital signs in last 24 hours: BP: ()/()  Arterial Line BP: ()/()    Physical Exam  Constitutional: Appears well-developed and well-nourished.  Psych: Affect somewhat flat and mildly anxious Eyes: No scleral injection HENT: No oropharyngeal obstruction.  MSK: no joint  deformities.  Cardiovascular: Normal rate and regular rhythm.  Respiratory: Effort normal, non-labored breathing GI: Soft.  No distension. There is no tenderness.  Skin: Warm dry and intact visible skin  Neuro: Mental Status: Patient is awake, alert, oriented to person, place, month, year, and situation. Patient is able to give a clear and coherent history, though often tangential and perseverative on No signs of aphasia or neglect Cranial Nerves: II: Visual Fields are full. Pupils are equal, round, and reactive to light.   III,IV, VI: EOMI without ptosis or diploplia.  V: Facial sensation is symmetric to temperature VII: Facial movement is symmetric when distracted.  Intermittently she will draw the corner of her mouth down on the right while talking VIII: hearing is intact to voice X: Uvula elevates symmetrically XII: tongue is midline without atrophy or fasciculations.  Motor: Tone is normal. Bulk is normal.  She has a highly distractible functional exam on the left and sometimes allows the limb to fall straight onto the bed but if it is held above her face will bring it out to the side first.  When vomiting she is able to support her body weight with the left arm.  She is intermittently able to do finger-to-nose testing with the left arm before having a drop. Sensory: She reports reduced sensation on the left side but briskly reacts to tape removal on that arm and IV sticks Deep Tendon Reflexes: 2+ and symmetric in the biceps and patellae, Achilles.  No clonus. Plantars: Toes are mute bilaterally.  Cerebellar: FNF intact on the right upper extremity, will not perform consistently on the left upper extremity  NIHSS total 12 Score breakdown: Formally, she has 2 points for right upper extremity weakness, 3 points for left upper extremity weakness, 3 points for left lower extremity weakness, 2 points for right lower extremity weakness, 2 points for sensory loss on the left side however  please note that she has a highly functional neurological exam as detailed above  I have reviewed labs in epic and the results pertinent to this consultation are: Beta-hCG negative Creatinine 0.77 Mild leukocytosis to 13.1 Ethanol level of 152  I have personally reviewed the images obtained: Head CT without acute intracranial process CTA without large vessel occlusion or significant stenosis CT spine without acute process  Impression: This is a 35 year old woman with a past medical history as above presenting with typical left-sided symptoms.  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  Perhaps some recrudescence of prior symptoms in the setting of acute intoxication.  Recommendations: - Supportive care for headache  - compazine 10 mg and bendadryl 25 mg q6hr  - 1 L NS and 2 g Mg - If symptoms remain persistent, please obtain MRI brain and C-spine to confirm no acute process and if these are negative patient may be discharged with outpatient neurology follow-up. - If symptoms improve substantially / resolve, outpatient neurology f/u given their a continuation of her chronic recurrent symptoms rather than a frankly new process   Lesleigh Noe MD-PhD [facility]  [phone] Available 7 PM to 7 AM, outside of these hours please call Neurologist on call as listed on Amion.

## 2020-11-06 NOTE — ED Provider Notes (Signed)
[facility] EMERGENCY DEPARTMENT Provider Note   CSN: [id number] Arrival date & time: 11/06/20  7628  An emergency department physician performed an initial assessment on this suspected stroke patient at [facility].  History Chief Complaint  Patient presents with  . Code Stroke    Melanie Thomas is a 35 y.o. female presents to the Emergency Department via EMS as Code Stroke after being found with right sided facial droop and left sided weakness onset 8 PM in conjunction with a headache.  EMS unsure if patient fell and patient cannot answer this question because she does not remember therefore c-collar was placed.  Does admit to alcohol tonight including 3 shots and one drink.  Vital signs within normal limits for EMS and CBG 141.  Patient reports associated weakness and paresthesias of the left arm and leg.  Several episodes of emesis with EMS for which she was given Zofran.  No specific aggravating or alleviating factors.  Patient does report that since decompression surgery she has not had recurrent full left hemibody numbness and the numbness she does have is limited to just her fingers and toes.  Additionally she reports she has "strokes" or "TIAs" about 3 times a month with left-sided symptoms which are not always associated with a headache.  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  Patient is followed by outside neurology.  The history is provided by the patient, medical records and the EMS personnel. No language interpreter was used.       Past Medical History:  Diagnosis Date  . Chiari malformation   . GERD (gastroesophageal reflux disease)   . History of angioedema   . PONV (postoperative nausea and vomiting)     Patient Active Problem List   Diagnosis Date Noted  . Balance problem 04/08/2019   . Dizziness 04/08/2019  . Exertional headache 04/08/2019  . Left leg numbness 04/08/2019  . Numbness and tingling in left arm 04/08/2019  . 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 New daily persistent headache 11/14/2015    Past Surgical History:  Procedure Laterality Date  . ANAL FISSURE REPAIR N/A 11/21/2012   Procedure: ANAL FISSURECTOMY;  Surgeon: Jamesetta So, MD;  Location: [facility];  Service: General;  Laterality: N/A;  . CESAREAN SECTION  2006   [facility]  . CHOLECYSTECTOMY N/A 08/06/2016   Procedure: LAPAROSCOPIC CHOLECYSTECTOMY;  Surgeon: [name], MD;  Location: [facility];  Service: General;  Laterality: N/A;  . CRANIECTOMY SUBOCCIPITAL W/ CERVICAL LAMINECTOMY / CHIARI Bilateral 02/29/2016   [facility]  2004, 2009     OB History   No obstetric history on file.     Family History  Problem Relation Age of Onset  . Hypothyroidism Other   . Hypertension Other   . Diabetes Other   . Breast cancer Other   . Heart disease Other   . Hypertension Other   . Diabetes Other   . Hypertension Other   . Diabetes Other   . Breast cancer Other   . Hypertension Other   . Diabetes Other   . Hypothyroidism Mother     Social History   Tobacco Use  . Smoking status: Former Smoker    Packs/day: 0.50    Years: 2.00    Pack years: 1.00    Types: Cigarettes  Quit date: 08/03/2007    Years since quitting: 13.2  . Smokeless tobacco: Never Used  Substance Use Topics  . Alcohol use: Yes    Comment: rare use  . Drug use: No    Home Medications Prior to Admission medications   Medication Sig Start Date End Date Taking? Authorizing Provider  cetirizine (ZYRTEC) 10 MG tablet Take 10 mg by mouth at bedtime.   Yes [provider]  cyclobenzaprine (FLEXERIL) 10 MG tablet Take 10 mg by mouth daily as needed for muscle spasms. 11/10/15  Yes [provider]  EPINEPHrine (EPIPEN JR) 0.15 MG/0.3ML injection Inject 0.15 mg  into the muscle as needed for anaphylaxis.   Yes [provider]  famotidine (PEPCID AC) 10 MG chewable tablet Chew 10 mg by mouth daily as needed for heartburn.   Yes [provider]  levothyroxine (SYNTHROID, LEVOTHROID) 75 MCG tablet Take 75 mcg by mouth daily before breakfast.   Yes [provider]  naproxen sodium (ANAPROX) 220 MG tablet Take 440 mg by mouth every 12 (twelve) hours as needed (pain).   Yes [provider]  promethazine (PHENERGAN) 50 MG tablet Take 1 tablet (50 mg total) by mouth every 6 (six) hours as needed for nausea or vomiting. Patient not taking: Reported on 11/06/2020 08/06/16   [name], MD    Allergies    Aspirin, Aspirin-acetaminophen-caffeine, Shrimp [shellfish allergy], Verapamil, Excedrin tension headache [acetaminophen-caffeine], Ibuprofen, Other, Gabapentin, Ketorolac, and Latex  Review of Systems   Review of Systems  Constitutional: Negative for appetite change, diaphoresis, fatigue, fever and unexpected weight change.  HENT: Negative for mouth sores.   Eyes: Negative for visual disturbance.  Respiratory: Negative for cough, chest tightness, shortness of breath and wheezing.   Cardiovascular: Negative for chest pain.  Gastrointestinal: Positive for nausea and vomiting. Negative for abdominal pain, constipation and diarrhea.  Endocrine: Negative for polydipsia, polyphagia and polyuria.  Genitourinary: Negative for dysuria, frequency, hematuria and urgency.  Musculoskeletal: Negative for back pain and neck stiffness.  Skin: Negative for rash.  Allergic/Immunologic: Negative for immunocompromised state.  Neurological: Positive for weakness, numbness and headaches. Negative for syncope and light-headedness.  Hematological: Does not bruise/bleed easily.  Psychiatric/Behavioral: Negative for sleep disturbance. The patient is not nervous/anxious.     Physical Exam Updated Vital Signs BP 109/67   Pulse 72   Temp 98 F  (36.7 C)   Resp 16   Ht 5\' 3"  (1.6 m)   Wt 96 kg   SpO2 100%   BMI 37.49 kg/m   Physical Exam Vitals and nursing note reviewed.  Constitutional:      General: She is not in acute distress.    Appearance: She is well-developed. She is not diaphoretic.  HENT:     Head: Normocephalic and atraumatic.  Eyes:     General: No scleral icterus.    Conjunctiva/sclera: Conjunctivae normal.     Pupils: Pupils are equal, round, and reactive to light.  Neck:     Comments: Patient arrives with c-collar in place.  No midline or paraspinal tenderness.  After clearing films c-collar removed - Full active and passive ROM without pain; No midline or paraspinal tenderness; no step-off or deformity. Cardiovascular:     Rate and Rhythm: Normal rate and regular rhythm.     Pulses: Normal pulses.          Radial pulses are 2+ on the right side and 2+ on the left side.  Pulmonary:     Effort: Pulmonary  effort is normal. No tachypnea, accessory muscle usage, prolonged expiration, respiratory distress or retractions.     Breath sounds: Normal breath sounds. No stridor. No wheezing or rales.     Comments: Equal chest rise. No increased work of breathing. Abdominal:     General: Bowel sounds are normal. There is no distension.     Palpations: Abdomen is soft.     Tenderness: There is no abdominal tenderness. There is no guarding or rebound.  Musculoskeletal:     Comments: No joint swelling or deformities  Lymphadenopathy:     Cervical: No cervical adenopathy.  Skin:    General: Skin is warm and dry.     Capillary Refill: Capillary refill takes less than 2 seconds.     Findings: No rash.  Neurological:     Mental Status: She is alert and oriented to person, place, and time.     GCS: GCS eye subscore is 4. GCS verbal subscore is 5. GCS motor subscore is 6.     Comments: See neurology note for full neurologic exam   Psychiatric:        Mood and Affect: Mood normal.        Behavior: Behavior  normal.        Thought Content: Thought content normal.        Judgment: Judgment normal.     ED Results / Procedures / Treatments   Labs (all labs ordered are listed, but only abnormal results are displayed) Labs Reviewed  ETHANOL - Abnormal; Notable for the following components:      Result Value   Alcohol, Ethyl (B) 152 (*)    All other components within normal limits  CBC - Abnormal; Notable for the following components:   WBC 13.1 (*)    All other components within normal limits  DIFFERENTIAL - Abnormal; Notable for the following components:   Neutro Abs 10.4 (*)    Abs Immature Granulocytes 0.09 (*)    All other components within normal limits  COMPREHENSIVE METABOLIC PANEL - Abnormal; Notable for the following components:   Glucose, Bld 113 (*)    All other components within normal limits  URINALYSIS, ROUTINE W REFLEX MICROSCOPIC - Abnormal; Notable for the following components:   Color, Urine STRAW (*)    All other components within normal limits  I-STAT CHEM 8, ED - Abnormal; Notable for the following components:   Glucose, Bld 109 (*)    Hemoglobin 15.3 (*)    All other components within normal limits  RESP PANEL BY RT-PCR (FLU A&B, COVID) ARPGX2  PROTIME-INR  APTT  RAPID URINE DRUG SCREEN, HOSP PERFORMED  I-STAT BETA HCG BLOOD, ED ([facility], [facility], [facility] ONLY)    EKG EKG Interpretation  Date/Time:  Sunday November 06 2020 03:02:55 EDT Ventricular Rate:  78 PR Interval:  138 QRS Duration: 94 QT Interval:  374 QTC Calculation: 426 R Axis:   66 Text Interpretation: Sinus rhythm Low voltage, precordial leads No acute changes Confirmed by Addison Lank [phone]) on 11/06/2020 4:58:46 AM     Radiology CT ANGIO HEAD W OR WO CONTRAST  Result Date: 11/06/2020 CLINICAL DATA:  Right-sided facial droop and arm weakness EXAM: CT ANGIOGRAPHY HEAD AND NECK CT CERVICAL SPINE WITHOUT CONTRAST TECHNIQUE: Multidetector CT imaging of the head and neck was performed using the standard  protocol during bolus administration of intravenous contrast. Multiplanar CT image reconstructions and MIPs were obtained to evaluate the vascular anatomy. Carotid stenosis measurements (when applicable) are obtained utilizing NASCET criteria,  using the distal internal carotid diameter as the denominator. CT of the cervical spine without contrast was also performed. CONTRAST:  60mL OMNIPAQUE IOHEXOL 350 MG/ML SOLN COMPARISON:  None. FINDINGS: CT CERVICAL SPINE FINDINGS Alignment: No static subluxation. Facets are aligned. Occipital condyles and the lateral masses of C1 and C2 are normally approximated. Skull base and vertebrae: C5-7 ACDF Soft tissues and spinal canal: No prevertebral fluid or swelling. No visible canal hematoma. Disc levels: No advanced spinal canal or neural foraminal stenosis. Upper chest: No pneumothorax, pulmonary nodule or pleural effusion. Other: Normal visualized paraspinal cervical soft tissues. CTA NECK FINDINGS SKELETON: There is no bony spinal canal stenosis. No lytic or blastic lesion. OTHER NECK: Normal pharynx, larynx and major salivary glands. No cervical lymphadenopathy. Unremarkable thyroid gland. UPPER CHEST: No pneumothorax or pleural effusion. No nodules or masses. AORTIC ARCH: There is no calcific atherosclerosis of the aortic arch. There is no aneurysm, dissection or hemodynamically significant stenosis of the visualized portion of the aorta. Conventional 3 vessel aortic branching pattern. The visualized proximal subclavian arteries are widely patent. RIGHT CAROTID SYSTEM: Normal without aneurysm, dissection or stenosis. LEFT CAROTID SYSTEM: Normal without aneurysm, dissection or stenosis. VERTEBRAL ARTERIES: Left dominant configuration. Both origins are clearly patent. There is no dissection, occlusion or flow-limiting stenosis to the skull base (V1-V3 segments). CTA HEAD FINDINGS POSTERIOR CIRCULATION: --Vertebral arteries: Normal V4 segments. --Inferior cerebellar arteries:  Normal. --Basilar artery: Normal. --Superior cerebellar arteries: Normal. --Posterior cerebral arteries (PCA): Normal. ANTERIOR CIRCULATION: --Intracranial internal carotid arteries: Normal. --Anterior cerebral arteries (ACA): Normal. Both A1 segments are present. Patent anterior communicating artery (a-comm). --Middle cerebral arteries (MCA): Normal. VENOUS SINUSES: As permitted by contrast timing, patent. ANATOMIC VARIANTS: None Review of the MIP images confirms the above findings. IMPRESSION: 1. No emergent large vessel occlusion or high-grade stenosis of the intracranial or cervical arteries. 2. No acute fracture or static subluxation of the cervical spine. Electronically Signed   By: Ulyses Jarred M.D.   On: 11/06/2020 02:49   CT ANGIO NECK W OR WO CONTRAST  Result Date: 11/06/2020 CLINICAL DATA:  Right-sided facial droop and arm weakness EXAM: CT ANGIOGRAPHY HEAD AND NECK CT CERVICAL SPINE WITHOUT CONTRAST TECHNIQUE: Multidetector CT imaging of the head and neck was performed using the standard protocol during bolus administration of intravenous contrast. Multiplanar CT image reconstructions and MIPs were obtained to evaluate the vascular anatomy. Carotid stenosis measurements (when applicable) are obtained utilizing NASCET criteria, using the distal internal carotid diameter as the denominator. CT of the cervical spine without contrast was also performed. CONTRAST:  46mL OMNIPAQUE IOHEXOL 350 MG/ML SOLN COMPARISON:  None. FINDINGS: CT CERVICAL SPINE FINDINGS Alignment: No static subluxation. Facets are aligned. Occipital condyles and the lateral masses of C1 and C2 are normally approximated. Skull base and vertebrae: C5-7 ACDF Soft tissues and spinal canal: No prevertebral fluid or swelling. No visible canal hematoma. Disc levels: No advanced spinal canal or neural foraminal stenosis. Upper chest: No pneumothorax, pulmonary nodule or pleural effusion. Other: Normal visualized paraspinal cervical soft  tissues. CTA NECK FINDINGS SKELETON: There is no bony spinal canal stenosis. No lytic or blastic lesion. OTHER NECK: Normal pharynx, larynx and major salivary glands. No cervical lymphadenopathy. Unremarkable thyroid gland. UPPER CHEST: No pneumothorax or pleural effusion. No nodules or masses. AORTIC ARCH: There is no calcific atherosclerosis of the aortic arch. There is no aneurysm, dissection or hemodynamically significant stenosis of the visualized portion of the aorta. Conventional 3 vessel aortic branching pattern. The visualized proximal subclavian arteries  are widely patent. RIGHT CAROTID SYSTEM: Normal without aneurysm, dissection or stenosis. LEFT CAROTID SYSTEM: Normal without aneurysm, dissection or stenosis. VERTEBRAL ARTERIES: Left dominant configuration. Both origins are clearly patent. There is no dissection, occlusion or flow-limiting stenosis to the skull base (V1-V3 segments). CTA HEAD FINDINGS POSTERIOR CIRCULATION: --Vertebral arteries: Normal V4 segments. --Inferior cerebellar arteries: Normal. --Basilar artery: Normal. --Superior cerebellar arteries: Normal. --Posterior cerebral arteries (PCA): Normal. ANTERIOR CIRCULATION: --Intracranial internal carotid arteries: Normal. --Anterior cerebral arteries (ACA): Normal. Both A1 segments are present. Patent anterior communicating artery (a-comm). --Middle cerebral arteries (MCA): Normal. VENOUS SINUSES: As permitted by contrast timing, patent. ANATOMIC VARIANTS: None Review of the MIP images confirms the above findings. IMPRESSION: 1. No emergent large vessel occlusion or high-grade stenosis of the intracranial or cervical arteries. 2. No acute fracture or static subluxation of the cervical spine. Electronically Signed   By: Ulyses Jarred M.D.   On: 11/06/2020 02:49   CT C-SPINE NO CHARGE  Result Date: 11/06/2020 CLINICAL DATA:  Right-sided facial droop and arm weakness EXAM: CT ANGIOGRAPHY HEAD AND NECK CT CERVICAL SPINE WITHOUT CONTRAST  TECHNIQUE: Multidetector CT imaging of the head and neck was performed using the standard protocol during bolus administration of intravenous contrast. Multiplanar CT image reconstructions and MIPs were obtained to evaluate the vascular anatomy. Carotid stenosis measurements (when applicable) are obtained utilizing NASCET criteria, using the distal internal carotid diameter as the denominator. CT of the cervical spine without contrast was also performed. CONTRAST:  76mL OMNIPAQUE IOHEXOL 350 MG/ML SOLN COMPARISON:  None. FINDINGS: CT CERVICAL SPINE FINDINGS Alignment: No static subluxation. Facets are aligned. Occipital condyles and the lateral masses of C1 and C2 are normally approximated. Skull base and vertebrae: C5-7 ACDF Soft tissues and spinal canal: No prevertebral fluid or swelling. No visible canal hematoma. Disc levels: No advanced spinal canal or neural foraminal stenosis. Upper chest: No pneumothorax, pulmonary nodule or pleural effusion. Other: Normal visualized paraspinal cervical soft tissues. CTA NECK FINDINGS SKELETON: There is no bony spinal canal stenosis. No lytic or blastic lesion. OTHER NECK: Normal pharynx, larynx and major salivary glands. No cervical lymphadenopathy. Unremarkable thyroid gland. UPPER CHEST: No pneumothorax or pleural effusion. No nodules or masses. AORTIC ARCH: There is no calcific atherosclerosis of the aortic arch. There is no aneurysm, dissection or hemodynamically significant stenosis of the visualized portion of the aorta. Conventional 3 vessel aortic branching pattern. The visualized proximal subclavian arteries are widely patent. RIGHT CAROTID SYSTEM: Normal without aneurysm, dissection or stenosis. LEFT CAROTID SYSTEM: Normal without aneurysm, dissection or stenosis. VERTEBRAL ARTERIES: Left dominant configuration. Both origins are clearly patent. There is no dissection, occlusion or flow-limiting stenosis to the skull base (V1-V3 segments). CTA HEAD FINDINGS  POSTERIOR CIRCULATION: --Vertebral arteries: Normal V4 segments. --Inferior cerebellar arteries: Normal. --Basilar artery: Normal. --Superior cerebellar arteries: Normal. --Posterior cerebral arteries (PCA): Normal. ANTERIOR CIRCULATION: --Intracranial internal carotid arteries: Normal. --Anterior cerebral arteries (ACA): Normal. Both A1 segments are present. Patent anterior communicating artery (a-comm). --Middle cerebral arteries (MCA): Normal. VENOUS SINUSES: As permitted by contrast timing, patent. ANATOMIC VARIANTS: None Review of the MIP images confirms the above findings. IMPRESSION: 1. No emergent large vessel occlusion or high-grade stenosis of the intracranial or cervical arteries. 2. No acute fracture or static subluxation of the cervical spine. Electronically Signed   By: Ulyses Jarred M.D.   On: 11/06/2020 02:49   CT HEAD CODE STROKE WO CONTRAST  Result Date: 11/06/2020 CLINICAL DATA:  Code stroke. Right-sided facial droop and arm weakness EXAM: CT  HEAD WITHOUT CONTRAST TECHNIQUE: Contiguous axial images were obtained from the base of the skull through the vertex without intravenous contrast. COMPARISON:  None. FINDINGS: Brain: There is no mass, hemorrhage or extra-axial collection. The size and configuration of the ventricles and extra-axial CSF spaces are normal. The brain parenchyma is normal, without evidence of acute or chronic infarction. Vascular: No abnormal hyperdensity of the major intracranial arteries or dural venous sinuses. No intracranial atherosclerosis. Skull: The visualized skull base, calvarium and extracranial soft tissues are normal. Sinuses/Orbits: No fluid levels or advanced mucosal thickening of the visualized paranasal sinuses. No mastoid or middle ear effusion. The orbits are normal. ASPECTS [facility] Stroke Program Early CT Score) - Ganglionic level infarction (caudate, lentiform nuclei, internal capsule, insula, M1-M3 cortex): 7 - Supraganglionic infarction (M4-M6 cortex):  3 Total score (0-10 with 10 being normal): 10 IMPRESSION: 1. Normal head CT. 2. ASPECTS is 10. These results were communicated to Dr. Lesleigh Noe at 1:54 am on 11/06/2020 by text page via the [facility] messaging system. Electronically Signed   By: Ulyses Jarred M.D.   On: 11/06/2020 01:54    Procedures .Critical Care Performed by: Abigail Butts, PA-C Authorized by: Abigail Butts, PA-C   Critical care provider statement:    Critical care time (minutes):  45   Critical care was necessary to treat or prevent imminent or life-threatening deterioration of the following conditions:  CNS failure or compromise   Critical care was time spent personally by me on the following activities:  Discussions with consultants, evaluation of patient's response to treatment, examination of patient, ordering and performing treatments and interventions, ordering and review of laboratory studies, ordering and review of radiographic studies, pulse oximetry, re-evaluation of patient's condition, obtaining history from patient or surrogate and review of old charts   I assumed direction of critical care for this patient from another provider in my specialty: no     Care discussed with comment:  Neurology  Ultrasound ED Peripheral IV (Provider)  Date/Time: 11/06/2020 6:25 AM Performed by: Abigail Butts, PA-C Authorized by: Abigail Butts, PA-C   Procedure details:    Indications: multiple failed IV attempts     Skin Prep: chlorhexidine gluconate     Location:  Left anterior forearm   Angiocath:  20 G   Bedside Ultrasound Guided: Yes     Images: not archived     Patient tolerated procedure without complications: Yes     Dressing applied: Yes       Medications Ordered in ED Medications  prochlorperazine (COMPAZINE) injection 10 mg (10 mg Intravenous Given 11/06/20 0325)    And  diphenhydrAMINE (BENADRYL) 25 mg in sodium chloride 0.9 % 50 mL IVPB (25 mg Intravenous Not Given 11/06/20 0325)   sodium chloride 0.9 % bolus 1,000 mL (0 mLs Intravenous Stopped 11/06/20 0430)  magnesium sulfate IVPB 2 g 50 mL (0 g Intravenous Stopped 11/06/20 0426)  iohexol (OMNIPAQUE) 350 MG/ML injection 60 mL (60 mLs Intravenous Contrast Given 11/06/20 0237)    ED Course  I have reviewed the triage vital signs and the nursing notes.  Pertinent labs & imaging results that were available during my care of the patient were reviewed by me and considered in my medical decision making (see chart for details).    MDM Rules/Calculators/A&P                           Patient presents as code stroke.  CT scans reassuring.  Given inconsistent  history and neuro exam concern for possible complicated migraine versus functional neurologic status.  Will treat headache and reassess.  Patient not given TPA as she is outside the TPA window. She is negative for LVO.  5:49 AM Patient treated for headache.  She reports symptoms have almost completely resolved and she feels significantly better.  Reports numbness is gone and she believes she can walk and would like to go home.  Will ambulate here in the department and reassess.  6:25 AM Patient continues to be well-appearing.  She is ambulated here in the department with steady gait and without assistance.  She has tolerated p.o.  Will discharge to home for close outpatient follow-up.   Final Clinical Impression(s) / ED Diagnoses Final diagnoses:  Complicated migraine    Rx / DC Orders ED Discharge Orders    None       Tyshauna Finkbiner, Gwenlyn Perking 11/06/20 1224    Fatima Blank, MD 11/06/20 [id number]

## 2020-11-06 NOTE — ED Triage Notes (Signed)
Per [facility] pt coming from home with right sided facial droop and left side weakness, and paraesthesia. Pt admits to etoh, drinking 3 shots and 1 drink. LKN 2200, AAO4, possible fall, c collar in place. 118/90 hr 90, cbg 141. 18g left ac.

## 2020-11-06 NOTE — ED Notes (Signed)
Patient verbalized understanding of discharge instructions. Opportunity for questions and answers.  

## 2020-11-06 NOTE — Discharge Instructions (Addendum)
1. Medications: usual home medications 2. Treatment: rest, drink plenty of fluids, refrain from drinking alcohol 3. Follow Up: Please followup with your primary doctor in 2 days for discussion of your diagnoses and further evaluation after today's visit; if you do not have a primary care doctor use the resource guide provided to find one; Please return to the ER for return or worsening of symptoms.

## 2021-01-17 ENCOUNTER — Encounter: Payer: Self-pay | Admitting: Internal Medicine

## 2021-03-02 ENCOUNTER — Encounter: Payer: Self-pay | Admitting: *Deleted

## 2021-03-02 ENCOUNTER — Other Ambulatory Visit: Payer: Self-pay

## 2021-03-02 ENCOUNTER — Encounter: Payer: Self-pay | Admitting: Internal Medicine

## 2021-03-02 ENCOUNTER — Ambulatory Visit: Payer: Medicaid Other | Admitting: Internal Medicine

## 2021-03-02 VITALS — BP 123/79 | HR 91 | Temp 97.8°F | Ht 62.5 in | Wt 201.2 lb

## 2021-03-02 DIAGNOSIS — R112 Nausea with vomiting, unspecified: Secondary | ICD-10-CM | POA: Diagnosis not present

## 2021-03-02 DIAGNOSIS — R1319 Other dysphagia: Secondary | ICD-10-CM | POA: Diagnosis not present

## 2021-03-02 DIAGNOSIS — R197 Diarrhea, unspecified: Secondary | ICD-10-CM | POA: Diagnosis not present

## 2021-03-02 DIAGNOSIS — K219 Gastro-esophageal reflux disease without esophagitis: Secondary | ICD-10-CM

## 2021-03-02 NOTE — Progress Notes (Signed)
Primary Care Physician:  Sharilyn Sites, MD Primary Gastroenterologist:  Dr. Abbey Chatters  Chief Complaint  Patient presents with   Dysphagia    Happened after neck surgery in 2018   change in stools    Loose stool, gallbladder removed 2 years ago    HPI:   Melanie Thomas is a 35 y.o. female who presents to the clinic today by referral from her PCP Dr. Hilma Favors for evaluation.  She has multiple GI complaints for me today.  She states she has had progressively worsening dysphagia.  Feels as though food will get stuck often in her substernal region.  No regurgitation.  Does note chronic issues with allergies and history of angioedema.  Does note chronic acid reflux as well for which she takes as needed Pepcid.  Also with nausea and vomiting which is chronic.  States this is due to her POTS syndrome.  Not unusual for her to vomit on a nearly daily basis.  States she had COVID this past year and was very sick from this.  Had nausea and vomiting daily for over a week.  She states the she then vomited blood for a few days after.  Also has chronic diarrhea.  States this has been going on since she had her gallbladder removed 2 years ago.  No melena hematochezia.  No abdominal pain.  Does note having cervical spine surgery around 5 years ago.  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  Past Medical History:  Diagnosis Date   Chiari malformation    GERD (gastroesophageal reflux disease)    History of angioedema    PONV (postoperative nausea and vomiting)     Past Surgical History:  Procedure Laterality Date   ANAL FISSURE REPAIR N/A 11/21/2012   Procedure: ANAL FISSURECTOMY;  Surgeon: Jamesetta So, MD;  Location: [facility];  Service: General;  Laterality: N/A;   CESAREAN SECTION  2006   [place] N/A 08/06/2016   Procedure: LAPAROSCOPIC CHOLECYSTECTOMY;  Surgeon: [name], MD;  Location: [facility];  Service: General;  Laterality: N/A;   CRANIECTOMY  SUBOCCIPITAL W/ CERVICAL LAMINECTOMY / CHIARI Bilateral 02/29/2016   [place]  2004, 2009    Current Outpatient Medications  Medication Sig Dispense Refill   cyclobenzaprine (FLEXERIL) 10 MG tablet Take 10 mg by mouth daily as needed for muscle spasms.     EPINEPHrine (EPIPEN JR) 0.15 MG/0.3ML injection Inject 0.15 mg into the muscle as needed for anaphylaxis.     famotidine (PEPCID AC) 10 MG chewable tablet Chew 10 mg by mouth daily as needed for heartburn.     Fexofenadine HCl (ALLEGRA PO) Take by mouth daily.     levothyroxine (SYNTHROID, LEVOTHROID) 75 MCG tablet Take 75 mcg by mouth daily before breakfast.     naproxen sodium (ANAPROX) 220 MG tablet Take 440 mg by mouth every 12 (twelve) hours as needed (pain).     No current facility-administered medications for this visit.    Allergies as of 03/02/2021 - Review Complete 03/02/2021  Allergen Reaction Noted   Aspirin Anaphylaxis, Rash, and Other (See Comments) 05/14/2012   Aspirin-acetaminophen-caffeine Anaphylaxis 12/12/2015   Shrimp [shellfish allergy] Anaphylaxis, Rash, and Other (See Comments) 05/14/2012   Verapamil Anaphylaxis 01/10/2016   Excedrin tension headache [acetaminophen-caffeine] Swelling 05/14/2012   Ibuprofen Swelling 05/14/2012   Other  05/14/2012   Gabapentin Swelling and Hives 12/12/2015   Ketorolac Rash 11/07/2015   Latex  Swelling and Rash 11/13/2012    Family History  Problem Relation Age of Onset   Hypothyroidism Other    Hypertension Other    Diabetes Other    Breast cancer Other    Heart disease Other    Hypertension Other    Diabetes Other    Hypertension Other    Diabetes Other    Breast cancer Other    Hypertension Other    Diabetes Other    Hypothyroidism Mother     Social History   Socioeconomic History   Marital status: Legally Separated    Spouse name: Not on file   Number of children: Not on file   Years of education: Not on file   Highest  education level: Not on file  Occupational History   Not on file  Tobacco Use   Smoking status: Former    Packs/day: 0.50    Years: 2.00    Pack years: 1.00    Types: Cigarettes    Quit date: 08/03/2007    Years since quitting: 13.5   Smokeless tobacco: Never  Substance and Sexual Activity   Alcohol use: Yes    Comment: rare use   Drug use: No   Sexual activity: Not on file  Other Topics Concern   Not on file  Social History Narrative   Not on file   Social Determinants of Health   Financial Resource Strain: Not on file  Food Insecurity: Not on file  Transportation Needs: Not on file  Physical Activity: Not on file  Stress: Not on file  Social Connections: Not on file  Intimate Partner Violence: Not on file    Subjective: Review of Systems  Constitutional:  Negative for chills and fever.  HENT:  Negative for congestion and hearing loss.   Eyes:  Negative for blurred vision and double vision.  Respiratory:  Negative for cough and shortness of breath.   Cardiovascular:  Negative for chest pain and palpitations.  Gastrointestinal:  Positive for diarrhea and heartburn. Negative for abdominal pain, blood in stool, constipation, melena and vomiting.       Dysphagia  Genitourinary:  Negative for dysuria and urgency.  Musculoskeletal:  Negative for joint pain and myalgias.  Skin:  Negative for itching and rash.  Neurological:  Negative for dizziness and headaches.  Psychiatric/Behavioral:  Negative for depression. The patient is not nervous/anxious.       Objective: BP 123/79   Pulse 91   Temp 97.8 F (36.6 C)   Ht 5' 2.5" (1.588 m)   Wt 201 lb 3.2 oz (91.3 kg)   LMP 02/15/2021   BMI 36.21 kg/m  Physical Exam Constitutional:      Appearance: Normal appearance.  HENT:     Head: Normocephalic and atraumatic.  Eyes:     Extraocular Movements: Extraocular movements intact.     Conjunctiva/sclera: Conjunctivae normal.  Cardiovascular:     Rate and Rhythm: Normal  rate and regular rhythm.  Pulmonary:     Effort: Pulmonary effort is normal.     Breath sounds: Normal breath sounds.  Abdominal:     General: Bowel sounds are normal.     Palpations: Abdomen is soft.  Musculoskeletal:        General: No swelling. Normal range of motion.     Cervical back: Normal range of motion and neck supple.  Skin:    General: Skin is warm and dry.     Coloration: Skin is not jaundiced.  Neurological:  General: No focal deficit present.     Mental Status: She is alert and oriented to person, place, and time.  Psychiatric:        Mood and Affect: Mood normal.        Behavior: Behavior normal.     Assessment: *Chronic GERD-relatively well controlled on as needed Pepcid *Dysphagia-progressively worsening *Nausea and vomiting *Diarrhea-chronic since cholecystectomy  Plan: Will schedule for EGD with esophageal biopsies to rule out EOE and possible dilation to evaluate for peptic ulcer disease, esophagitis, gastritis, H. Pylori, duodenitis, or other. Will also evaluate for esophageal stricture, Schatzki's ring, esophageal web or other.   The risks including infection, bleed, or perforation as well as benefits, limitations, alternatives and imponderables have been reviewed with the patient. Potential for esophageal dilation, biopsy, etc. have also been reviewed.  Questions have been answered. All parties agreeable.  Continue on Pepcid as needed for now.  We may need to escalate to PPI therapy depending on EGD findings.  Possible her dysphagia is from extrinsic compression from cervical surgery.  Chronic diarrhea likely bile acid induced in the setting of cholecystectomy.  I have recommended that she start taking over-the-counter Imodium to see if this helps.  If not, we can consider adding cholestyramine.  Further recommendations to follow.  Thank you Dr. Hilma Favors for the kind referral.   03/02/2021 9:29 AM   Disclaimer: This note was dictated with voice  recognition software. Similar sounding words can inadvertently be transcribed and may not be corrected upon review.

## 2021-03-02 NOTE — Patient Instructions (Signed)
For your difficulty swallowing, acid reflux, history of vomiting blood, we will schedule you for upper endoscopy to further evaluate.  I may perform esophageal dilation depending on findings to help with your swallowing.  Continue on Pepcid as needed for now.  We may need to make adjustments to this depending on endoscopic findings.  For your chronic diarrhea, I would recommend taking over-the-counter Imodium.  If this does not work we can consider adding cholestyramine for bile acid diarrhea.  It was nice meeting you today.  Dr. Abbey Chatters  At [facility] we value your feedback. You may receive a survey about your visit today. Please share your experience as we strive to create trusting relationships with our patients to provide genuine, compassionate, quality care.  We appreciate your understanding and patience as we review any laboratory studies, imaging, and other diagnostic tests that are ordered as we care for you. Our office policy is 5 business days for review of these results, and any emergent or urgent results are addressed in a timely manner for your best interest. If you do not hear from our office in 1 week, please contact us.   We also encourage the use of MyChart, which contains your medical information for your review as well. If you are not enrolled in this feature, an access code is on this after visit summary for your convenience. Thank you for allowing [place] to be involved in your care.  It was great to see you today!  I hope you have a great rest of your summer!!    Melanie Thomas. Abbey Chatters, D.O. Gastroenterology and Hepatology [facility]

## 2021-03-30 NOTE — Patient Instructions (Signed)
Melanie Thomas  03/30/2021     '@PREFPERIOPPHARMACY'$ @   Your procedure is scheduled on  04/07/2021.   Report to [facility] at  0700  A.M.   Call this number if you have problems the morning of surgery:  [phone]   Remember:  Follow the diet instructions given to you by the office.    Take these medicines the morning of surgery with A SIP OF WATER          flexeril (if needed), pepcid, allegra, levothyroxine, phenergan (if needed), ubelvy.     Do not wear jewelry, make-up or nail polish.  Do not wear lotions, powders, or perfumes, or deodorant.  Do not shave 48 hours prior to surgery.  Men may shave face and neck.  Do not bring valuables to the hospital.  [facility] is not responsible for any belongings or valuables.  Contacts, dentures or bridgework may not be worn into surgery.  Leave your suitcase in the car.  After surgery it may be brought to your room.  For patients admitted to the hospital, discharge time will be determined by your treatment team.  Patients discharged the day of surgery will not be allowed to drive home and must have someone with them for 24 hours.    Special instructions:   DO NOT smoke tobacco or vape for 24 hours before your procedure.  Please read over the following fact sheets that you were given. Anesthesia Post-op Instructions and Care and Recovery After Surgery      Upper Endoscopy, Adult, Care After This sheet gives you information about how to care for yourself after your procedure. Your health care provider may also give you more specific instructions. If you have problems or questions, contact your health care provider. What can I expect after the procedure? After the procedure, it is common to have: A sore throat. Mild stomach pain or discomfort. Bloating. Nausea. Follow these instructions at home:  Follow instructions from your health care provider about what to eat or drink after your procedure. Return to  your normal activities as told by your health care provider. Ask your health care provider what activities are safe for you. Take over-the-counter and prescription medicines only as told by your health care provider. If you were given a sedative during the procedure, it can affect you for several hours. Do not drive or operate machinery until your health care provider says that it is safe. Keep all follow-up visits as told by your health care provider. This is important. Contact a health care provider if you have: A sore throat that lasts longer than one day. Trouble swallowing. Get help right away if: You vomit blood or your vomit looks like coffee grounds. You have: A fever. Bloody, black, or tarry stools. A severe sore throat or you cannot swallow. Difficulty breathing. Severe pain in your chest or abdomen. Summary After the procedure, it is common to have a sore throat, mild stomach discomfort, bloating, and nausea. If you were given a sedative during the procedure, it can affect you for several hours. Do not drive or operate machinery until your health care provider says that it is safe. Follow instructions from your health care provider about what to eat or drink after your procedure. Return to your normal activities as told by your health care provider. This information is not intended to replace advice given to you by your health care provider. Make sure you discuss any  questions you have with your health care provider. Document Revised: 07/14/2019 Document Reviewed: 12/16/2017 Elsevier Patient Education  2022 [place]. Esophageal Dilatation Esophageal dilatation, also called esophageal dilation, is a procedure to widen or open a blocked or narrowed part of the esophagus. The esophagus is the part of the body that moves food and liquid from the mouth to the stomach. You may need this procedure if: You have a buildup of scar tissue in your esophagus that makes it difficult,  painful, or impossible to swallow. This can be caused by gastroesophageal reflux disease (GERD). You have cancer of the esophagus. There is a problem with how food moves through your esophagus. In some cases, you may need this procedure repeated at a later time to dilate the esophagus gradually. Tell a health care provider about: Any allergies you have. All medicines you are taking, including vitamins, herbs, eye drops, creams, and over-the-counter medicines. Any problems you or family members have had with anesthetic medicines. Any blood disorders you have. Any surgeries you have had. Any medical conditions you have. Any antibiotic medicines you are required to take before dental procedures. Whether you are pregnant or may be pregnant. What are the risks? Generally, this is a safe procedure. However, problems may occur, including: Bleeding due to a tear in the lining of the esophagus. A hole, or perforation, in the esophagus. What happens before the procedure? Ask your health care provider about: Changing or stopping your regular medicines. This is especially important if you are taking diabetes medicines or blood thinners. Taking medicines such as aspirin and ibuprofen. These medicines can thin your blood. Do not take these medicines unless your health care provider tells you to take them. Taking over-the-counter medicines, vitamins, herbs, and supplements. Follow instructions from your health care provider about eating or drinking restrictions. Plan to have a responsible adult take you home from the hospital or clinic. Plan to have a responsible adult care for you for the time you are told after you leave the hospital or clinic. This is important. What happens during the procedure? You may be given a medicine to help you relax (sedative). A numbing medicine may be sprayed into the back of your throat, or you may gargle the medicine. Your health care provider may perform the dilatation  using various surgical instruments, such as: Simple dilators. This instrument is carefully placed in the esophagus to stretch it. Guided wire bougies. This involves using an endoscope to insert a wire into the esophagus. A dilator is passed over this wire to enlarge the esophagus. Then the wire is removed. Balloon dilators. An endoscope with a small balloon is inserted into the esophagus. The balloon is inflated to stretch the esophagus and open it up. The procedure may vary among health care providers and hospitals. What can I expect after the procedure? Your blood pressure, heart rate, breathing rate, and blood oxygen level will be monitored until you leave the hospital or clinic. Your throat may feel slightly sore and numb. This will get better over time. You will not be allowed to eat or drink until your throat is no longer numb. When you are able to drink, urinate, and sit on the edge of the bed without nausea or dizziness, you may be able to return home. Follow these instructions at home: Take over-the-counter and prescription medicines only as told by your health care provider. If you were given a sedative during the procedure, it can affect you for several hours. Do not drive  or operate machinery until your health care provider says that it is safe. Plan to have a responsible adult care for you for the time you are told. This is important. Follow instructions from your health care provider about any eating or drinking restrictions. Do not use any products that contain nicotine or tobacco, such as cigarettes, e-cigarettes, and chewing tobacco. If you need help quitting, ask your health care provider. Keep all follow-up visits. This is important. Contact a health care provider if: You have a fever. You have pain that is not relieved by medicine. Get help right away if: You have chest pain. You have trouble breathing. You have trouble swallowing. You vomit blood. You have black, tarry,  or bloody stools. These symptoms may represent a serious problem that is an emergency. Do not wait to see if the symptoms will go away. Get medical help right away. Call your local emergency services (911 in the [place] Do not drive yourself to the hospital. Summary Esophageal dilatation, also called esophageal dilation, is a procedure to widen or open a blocked or narrowed part of the esophagus. Plan to have a responsible adult take you home from the hospital or clinic. For this procedure, a numbing medicine may be sprayed into the back of your throat, or you may gargle the medicine. Do not drive or operate machinery until your health care provider says that it is safe. This information is not intended to replace advice given to you by your health care provider. Make sure you discuss any questions you have with your health care provider. Document Revised: 12/02/2019 Document Reviewed: 12/02/2019 Elsevier Patient Education  [place] This sheet gives you information about how to care for yourself after your procedure. Your health care provider may also give you more specific instructions. If you have problems or questions, contact your health care provider. What can I expect after the procedure? After the procedure, it is common to have: Tiredness. Forgetfulness about what happened after the procedure. Impaired judgment for important decisions. Nausea or vomiting. Some difficulty with balance. Follow these instructions at home: For the time period you were told by your health care provider:   Rest as needed. Do not participate in activities where you could fall or become injured. Do not drive or use machinery. Do not drink alcohol. Do not take sleeping pills or medicines that cause drowsiness. Do not make important decisions or sign legal documents. Do not take care of children on your own. Eating and drinking Follow the diet that is  recommended by your health care provider. Drink enough fluid to keep your urine pale yellow. If you vomit: Drink water, juice, or soup when you can drink without vomiting. Make sure you have little or no nausea before eating solid foods. General instructions Have a responsible adult stay with you for the time you are told. It is important to have someone help care for you until you are awake and alert. Take over-the-counter and prescription medicines only as told by your health care provider. If you have sleep apnea, surgery and certain medicines can increase your risk for breathing problems. Follow instructions from your health care provider about wearing your sleep device: Anytime you are sleeping, including during daytime naps. While taking prescription pain medicines, sleeping medicines, or medicines that make you drowsy. Avoid smoking. Keep all follow-up visits as told by your health care provider. This is important. Contact a health care provider if: You keep feeling nauseous  or you keep vomiting. You feel light-headed. You are still sleepy or having trouble with balance after 24 hours. You develop a rash. You have a fever. You have redness or swelling around the IV site. Get help right away if: You have trouble breathing. You have new-onset confusion at home. Summary For several hours after your procedure, you may feel tired. You may also be forgetful and have poor judgment. Have a responsible adult stay with you for the time you are told. It is important to have someone help care for you until you are awake and alert. Rest as told. Do not drive or operate machinery. Do not drink alcohol or take sleeping pills. Get help right away if you have trouble breathing, or if you suddenly become confused. This information is not intended to replace advice given to you by your health care provider. Make sure you discuss any questions you have with your health care provider. Document  Revised: 03/31/2020 Document Reviewed: 06/18/2019 Elsevier Patient Education  2022 [employer].

## 2021-04-04 ENCOUNTER — Encounter (HOSPITAL_COMMUNITY): Payer: Self-pay

## 2021-04-04 ENCOUNTER — Other Ambulatory Visit: Payer: Self-pay

## 2021-04-04 ENCOUNTER — Encounter (HOSPITAL_COMMUNITY)
Admission: RE | Admit: 2021-04-04 | Discharge: 2021-04-04 | Disposition: A | Discharge status: Home / Self Care | Payer: Medicaid Other | Source: Ambulatory Visit | Attending: Internal Medicine | Admitting: Internal Medicine

## 2021-04-04 DIAGNOSIS — Z01812 Encounter for preprocedural laboratory examination: Secondary | ICD-10-CM | POA: Diagnosis present

## 2021-04-04 HISTORY — DX: Postural orthostatic tachycardia syndrome (POTS): G90.A

## 2021-04-04 HISTORY — DX: Other specified cardiac arrhythmias: I49.8

## 2021-04-04 LAB — PREGNANCY, URINE: Preg Test, Ur: NEGATIVE

## 2021-04-05 ENCOUNTER — Other Ambulatory Visit (HOSPITAL_COMMUNITY): Payer: Medicaid Other

## 2021-04-07 ENCOUNTER — Encounter (HOSPITAL_COMMUNITY)
Admission: RE | Disposition: A | Discharge status: Home / Self Care | Payer: Self-pay | Source: Home / Self Care | Attending: Internal Medicine

## 2021-04-07 ENCOUNTER — Ambulatory Visit (HOSPITAL_COMMUNITY)
Admission: RE | Admit: 2021-04-07 | Discharge: 2021-04-07 | Disposition: A | Discharge status: Home / Self Care | Payer: Medicaid Other | Attending: Internal Medicine | Admitting: Internal Medicine

## 2021-04-07 ENCOUNTER — Encounter (HOSPITAL_COMMUNITY): Payer: Self-pay

## 2021-04-07 ENCOUNTER — Ambulatory Visit (HOSPITAL_COMMUNITY): Payer: Medicaid Other | Admitting: Anesthesiology

## 2021-04-07 ENCOUNTER — Other Ambulatory Visit: Payer: Self-pay

## 2021-04-07 DIAGNOSIS — R1310 Dysphagia, unspecified: Secondary | ICD-10-CM

## 2021-04-07 DIAGNOSIS — Z791 Long term (current) use of non-steroidal anti-inflammatories (NSAID): Secondary | ICD-10-CM | POA: Diagnosis not present

## 2021-04-07 DIAGNOSIS — K319 Disease of stomach and duodenum, unspecified: Secondary | ICD-10-CM | POA: Diagnosis not present

## 2021-04-07 DIAGNOSIS — Z8616 Personal history of COVID-19: Secondary | ICD-10-CM | POA: Diagnosis not present

## 2021-04-07 DIAGNOSIS — K2100 Gastro-esophageal reflux disease with esophagitis, without bleeding: Secondary | ICD-10-CM | POA: Diagnosis not present

## 2021-04-07 DIAGNOSIS — Z888 Allergy status to other drugs, medicaments and biological substances status: Secondary | ICD-10-CM | POA: Diagnosis not present

## 2021-04-07 DIAGNOSIS — Z886 Allergy status to analgesic agent status: Secondary | ICD-10-CM | POA: Insufficient documentation

## 2021-04-07 DIAGNOSIS — K2210 Ulcer of esophagus without bleeding: Secondary | ICD-10-CM | POA: Diagnosis not present

## 2021-04-07 DIAGNOSIS — D490 Neoplasm of unspecified behavior of digestive system: Secondary | ICD-10-CM

## 2021-04-07 DIAGNOSIS — Z91013 Allergy to seafood: Secondary | ICD-10-CM | POA: Insufficient documentation

## 2021-04-07 DIAGNOSIS — K2970 Gastritis, unspecified, without bleeding: Secondary | ICD-10-CM

## 2021-04-07 DIAGNOSIS — Z9049 Acquired absence of other specified parts of digestive tract: Secondary | ICD-10-CM | POA: Insufficient documentation

## 2021-04-07 DIAGNOSIS — Z87892 Personal history of anaphylaxis: Secondary | ICD-10-CM | POA: Insufficient documentation

## 2021-04-07 DIAGNOSIS — Z79899 Other long term (current) drug therapy: Secondary | ICD-10-CM | POA: Insufficient documentation

## 2021-04-07 DIAGNOSIS — Z91040 Latex allergy status: Secondary | ICD-10-CM | POA: Insufficient documentation

## 2021-04-07 DIAGNOSIS — I498 Other specified cardiac arrhythmias: Secondary | ICD-10-CM | POA: Insufficient documentation

## 2021-04-07 DIAGNOSIS — Z79890 Hormone replacement therapy (postmenopausal): Secondary | ICD-10-CM | POA: Insufficient documentation

## 2021-04-07 DIAGNOSIS — K529 Noninfective gastroenteritis and colitis, unspecified: Secondary | ICD-10-CM | POA: Diagnosis not present

## 2021-04-07 DIAGNOSIS — Z87891 Personal history of nicotine dependence: Secondary | ICD-10-CM | POA: Diagnosis not present

## 2021-04-07 HISTORY — PX: BIOPSY: SHX5522

## 2021-04-07 HISTORY — PX: ESOPHAGOGASTRODUODENOSCOPY (EGD) WITH PROPOFOL: SHX5813

## 2021-04-07 HISTORY — PX: BALLOON DILATION: SHX5330

## 2021-04-07 SURGERY — ESOPHAGOGASTRODUODENOSCOPY (EGD) WITH PROPOFOL
Anesthesia: General

## 2021-04-07 MED ORDER — OMEPRAZOLE 20 MG PO CPDR: 20.0000 mg | DELAYED_RELEASE_CAPSULE | Freq: Two times a day (BID) | ORAL | 5 refills | Status: DC

## 2021-04-07 MED ORDER — LACTATED RINGERS IV SOLN: INTRAVENOUS | Status: DC

## 2021-04-07 MED ORDER — STERILE WATER FOR IRRIGATION IR SOLN
Status: DC | PRN
  Administered 2021-04-07: 100 mL

## 2021-04-07 MED ORDER — LIDOCAINE HCL (CARDIAC) PF 100 MG/5ML IV SOSY
PREFILLED_SYRINGE | INTRAVENOUS | Status: DC | PRN
  Administered 2021-04-07: 80 mg via INTRAVENOUS

## 2021-04-07 MED ORDER — PROPOFOL 10 MG/ML IV BOLUS
INTRAVENOUS | Status: DC | PRN
  Administered 2021-04-07 (×2): 100 mg via INTRAVENOUS
  Administered 2021-04-07: 50 mg via INTRAVENOUS

## 2021-04-07 NOTE — Transfer of Care (Signed)
Immediate Anesthesia Transfer of Care Note  Patient: Melanie Thomas  Procedure(s) Performed: ESOPHAGOGASTRODUODENOSCOPY (EGD) WITH PROPOFOL BALLOON DILATION BIOPSY  Patient Location: Short Stay  Anesthesia Type:General  Level of Consciousness: awake  Airway & Oxygen Therapy: Patient Spontanous Breathing  Post-op Assessment: Report given to RN  Post vital signs: Reviewed and stable  Last Vitals:  Vitals Value Taken Time  BP    Temp    Pulse    Resp    SpO2      Last Pain:  Vitals:   04/07/21 0853  TempSrc:   PainSc: 3       Patients Stated Pain Goal: 8 ([id number])  Complications: No notable events documented.

## 2021-04-07 NOTE — Op Note (Addendum)
[facility] Patient Name: Melanie Thomas Procedure Date: 04/07/2021 8:40 AM MRN: [id number] Date of Birth: 11/01/1985 Attending MD: Elon Alas. Abbey Chatters DO CSN: [id number] Age: 35 Admit Type: Outpatient Procedure:                Upper GI endoscopy Indications:              Dysphagia, Heartburn, Nausea Providers:                Elon Alas. Abbey Chatters, DO, Lambert Mody, Caprice Kluver, Kristine L. Risa Grill, Technician Referring MD:              Medicines:                See the Anesthesia note for documentation of the                            administered medications Complications:            No immediate complications. Estimated Blood Loss:     Estimated blood loss was minimal. Procedure:                Pre-Anesthesia Assessment:                           - The anesthesia plan was to use monitored                            anesthesia care (MAC).                           After obtaining informed consent, the endoscope was                            passed under direct vision. Throughout the                            procedure, the patient's blood pressure, pulse, and                            oxygen saturations were monitored continuously. The                            GIF-H190 ([id number]) scope was introduced through the                            mouth, and advanced to the second part of duodenum.                            The upper GI endoscopy was accomplished without                            difficulty. The patient tolerated the procedure                            well. Scope  In: 8:57:32 AM Scope Out: 9:05:09 AM Total Procedure Duration: 0 hours 7 minutes 37 seconds  Findings:      A 10-47m, soft tissue mass with no bleeding and no stigmata of recent       bleeding was found in the proximal esophagus, 18 cm from the incisors.       The mass was non-obstructing and not circumferential. Biopsies were       taken with a cold forceps for  histology.      Biopsies were taken with a cold forceps in the middle third of the       esophagus for histology.      Preparations were made for empiric dilation. A TTS dilator was passed       through the scope. Dilation with an 18-19-20 mm balloon dilator was       performed to 20 mm. Dilation was performed with a mild resistance at 20       mm . Estimated blood loss was none.      Localized moderate inflammation characterized by erythema and shallow       ulcerations was found in the gastric antrum. Biopsies were taken with a       cold forceps for Helicobacter pylori testing.      The duodenal bulb, first portion of the duodenum and second portion of       the duodenum were normal. 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 Biopsied.                           - Gastritis. Biopsied.                           - Normal duodenal bulb, first portion of the                            duodenum and second portion of the duodenum.                           - Biopsies were taken with a cold forceps for                            histology in the middle third of the esophagus. Moderate Sedation:      Per Anesthesia Care Recommendation:           - Patient has a contact number available for                            emergencies. The signs and symptoms of potential                            delayed complications were discussed with the                            patient. Return to normal activities tomorrow.  Written discharge instructions were provided to the                            patient.                           - Resume previous diet.                           - Continue present medications.                           - Await pathology results.                           - Use Prilosec (omeprazole) 20 mg PO BID.                           - Await biopsy results soft tissue polyp/mass in                             the esophagus. 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 She may                            require EUS for further evaluation. Will discuss                            with advanced endoscopy in [facility] after                            pathology evaluated. Procedure Code(s):        --- Professional ---                           [phone], Esophagogastroduodenoscopy, flexible,                            transoral; with biopsy, single or multiple Diagnosis Code(s):        --- Professional ---                           D49.0, Neoplasm of unspecified behavior of                            digestive system                           K29.70, Gastritis, unspecified, without bleeding                           R13.10, Dysphagia, unspecified                           R12, Heartburn                           R11.0, Nausea CPT copyright 2019 American Medical Association. All rights reserved. The codes documented in  this report are preliminary and upon coder review may  be revised to meet current compliance requirements. Elon Alas. Abbey Chatters, DO Barry Abbey Chatters, DO 04/07/2021 9:12:35 AM This report has been signed electronically. Number of Addenda: 0

## 2021-04-07 NOTE — H&P (Signed)
Primary Care Physician:  Sharilyn Sites, MD Primary Gastroenterologist:  Dr. Abbey Chatters  Pre-Procedure History & Physical: HPI:  Melanie Thomas is a 35 y.o. female is here for an EGD with dilation for GERD Nausea, dysphagia. She states she has had progressively worsening dysphagia.  Feels as though food will get stuck often in her substernal region.  No regurgitation.  Does note chronic issues with allergies and history of angioedema.   Does note chronic acid reflux as well for which she takes as needed Pepcid.  Also with nausea and vomiting which is chronic.  States this is due to her POTS syndrome.  Not unusual for her to vomit on a nearly daily basis.  States she had COVID this past year and was very sick from this.  Had nausea and vomiting daily for over a week.  She states the she then vomited blood for a few days after.  Also has chronic diarrhea.  States this has been going on since she had her gallbladder removed 2 years ago.  No melena hematochezia.  No abdominal pain.  Does note having cervical spine surgery around 5 years ago.  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  Past Medical History:  Diagnosis Date   Chiari malformation    GERD (gastroesophageal reflux disease)    History of angioedema    PONV (postoperative nausea and vomiting)    POTS (postural orthostatic tachycardia syndrome)     Past Surgical History:  Procedure Laterality Date   ANAL FISSURE REPAIR N/A 11/21/2012   Procedure: ANAL FISSURECTOMY;  Surgeon: Jamesetta So, MD;  Location: [facility];  Service: General;  Laterality: N/A;   CESAREAN SECTION  2006   [place]   CHOLECYSTECTOMY N/A 08/06/2016   Procedure: LAPAROSCOPIC CHOLECYSTECTOMY;  Surgeon: [name], MD;  Location: [facility];  Service: General;  Laterality: N/A;   CRANIECTOMY [place] / CHIARI Bilateral 02/29/2016   [place] SURGERY  2004, 2009    Prior to Admission  medications   Medication Sig Start Date End Date Taking? Authorizing Provider  cyclobenzaprine (FLEXERIL) 10 MG tablet Take 10 mg by mouth 3 (three) times daily as needed for muscle spasms. 11/10/15  Yes [provider]  famotidine (PEPCID AC) 10 MG chewable tablet Chew 10 mg by mouth daily as needed for heartburn.   Yes [provider]  Fexofenadine HCl (ALLEGRA PO) Take 180 mg by mouth daily.   Yes [provider]  levothyroxine (SYNTHROID, LEVOTHROID) 75 MCG tablet Take 75 mcg by mouth daily before breakfast.   Yes [provider]  naproxen sodium (ANAPROX) 220 MG tablet Take 440 mg by mouth every 12 (twelve) hours as needed (pain).   Yes [provider]  promethazine (PHENERGAN) 25 MG tablet Take 25 mg by mouth every 6 (six) hours as needed for nausea/vomiting. 01/03/21  Yes [provider]  UBRELVY 100 MG TABS Take 1 tablet by mouth as needed for migraine. 03/08/21  Yes [provider]  EPINEPHrine (EPIPEN JR) 0.15 MG/0.3ML injection Inject 0.15 mg into the muscle as needed for anaphylaxis.    [provider]    Allergies as of 03/02/2021 - Review Complete 03/02/2021  Allergen Reaction Noted   Aspirin Anaphylaxis, Rash, and Other (See Comments) 05/14/2012   Aspirin-acetaminophen-caffeine Anaphylaxis 12/12/2015   Shrimp [shellfish allergy] Anaphylaxis, Rash, and Other (See Comments) 05/14/2012   Verapamil Anaphylaxis 01/10/2016   Excedrin tension headache [acetaminophen-caffeine] Swelling  05/14/2012   Ibuprofen Swelling 05/14/2012   Other  05/14/2012   Gabapentin Swelling and Hives 12/12/2015   Ketorolac Rash 11/07/2015   Latex Swelling and Rash 11/13/2012    Family History  Problem Relation Age of Onset   Hypothyroidism Other    Hypertension Other    Diabetes Other    Breast cancer Other    Heart disease Other    Hypertension Other    Diabetes Other    Hypertension Other    Diabetes Other    Breast cancer  Other    Hypertension Other    Diabetes Other    Hypothyroidism Mother     Social History   Socioeconomic History   Marital status: Legally Separated    Spouse name: Not on file   Number of children: Not on file   Years of education: Not on file   Highest education level: Not on file  Occupational History   Not on file  Tobacco Use   Smoking status: Former    Packs/day: 0.50    Years: 2.00    Pack years: 1.00    Types: Cigarettes    Quit date: 08/03/2007    Years since quitting: 13.6   Smokeless tobacco: Never  Vaping Use   Vaping Use: Never used  Substance and Sexual Activity   Alcohol use: Yes    Comment: rare use   Drug use: No   Sexual activity: Yes  Other Topics Concern   Not on file  Social History Narrative   Not on file   Social Determinants of Health   Financial Resource Strain: Not on file  Food Insecurity: Not on file  Transportation Needs: Not on file  Physical Activity: Not on file  Stress: Not on file  Social Connections: Not on file  Intimate Partner Violence: Not on file    Review of Systems: See HPI, otherwise negative ROS  Physical Exam: Vital signs in last 24 hours: Temp:  [98.6 F (37 C)] 98.6 F (37 C) (09/09 0734) Pulse Rate:  [83] 83 (09/09 0734) Resp:  [24] 24 (09/09 0734) BP: (121)/(70) 121/70 (09/09 0734) SpO2:  [96 %] 96 % (09/09 0734) Weight:  [90.7 kg] 90.7 kg (09/09 0734)   General:   Alert,  Well-developed, well-nourished, pleasant and cooperative in NAD Head:  Normocephalic and atraumatic. Eyes:  Sclera clear, no icterus.   Conjunctiva pink. Ears:  Normal auditory acuity. Nose:  No deformity, discharge,  or lesions. Mouth:  No deformity or lesions, dentition normal. Neck:  Supple; no masses or thyromegaly. Lungs:  Clear throughout to auscultation.   No wheezes, crackles, or rhonchi. No acute distress. Heart:  Regular rate and rhythm; no murmurs, clicks, rubs,  or gallops. Abdomen:  Soft, nontender and nondistended.  No masses, hepatosplenomegaly or hernias noted. Normal bowel sounds, without guarding, and without rebound.   Msk:  Symmetrical without gross deformities. Normal posture. Extremities:  Without clubbing or edema. Neurologic:  Alert and  oriented x4;  grossly normal neurologically. Skin:  Intact without significant lesions or rashes. Cervical Nodes:  No significant cervical adenopathy. Psych:  Alert and cooperative. Normal mood and affect.  Impression/Plan: Quintella Baton is here for an EGD with dilation for GERD Nausea, dysphagia.   The risks of the procedure including infection, bleed, or perforation as well as benefits, limitations, alternatives and imponderables have been reviewed with the patient. Questions have been answered. All parties agreeable.

## 2021-04-07 NOTE — Anesthesia Postprocedure Evaluation (Signed)
Anesthesia Post Note  Patient: Melanie Thomas  Procedure(s) Performed: ESOPHAGOGASTRODUODENOSCOPY (EGD) WITH PROPOFOL BALLOON DILATION BIOPSY  Patient location during evaluation: Short Stay Anesthesia Type: General Level of consciousness: awake and alert and oriented Pain management: pain level controlled Vital Signs Assessment: post-procedure vital signs reviewed and stable Respiratory status: spontaneous breathing Cardiovascular status: blood pressure returned to baseline and stable Postop Assessment: no apparent nausea or vomiting Anesthetic complications: no   No notable events documented.   Last Vitals:  Vitals:   04/07/21 0734 04/07/21 0911  BP: 121/70 (!) 93/45  Pulse: 83 79  Resp: (!) 24 18  Temp: 37 C 36.6 C  SpO2: 96% 100%    Last Pain:  Vitals:   04/07/21 0911  TempSrc: Oral  PainSc: 4                  Betina Puckett

## 2021-04-07 NOTE — Anesthesia Preprocedure Evaluation (Signed)
Anesthesia Evaluation  Patient identified by MRN, date of birth, ID band Patient awake    Reviewed: Allergy & Precautions, H&P , NPO status , Patient's Chart, lab work & pertinent test results, reviewed documented beta blocker date and time   History of Anesthesia Complications (+) PONV and history of anesthetic complications  Airway Mallampati: II  TM Distance: >3 FB Neck ROM: full    Dental no notable dental hx.    Pulmonary neg pulmonary ROS, former smoker,    Pulmonary exam normal breath sounds clear to auscultation       Cardiovascular Exercise Tolerance: Good negative cardio ROS   Rhythm:regular Rate:Normal     Neuro/Psych  Headaches, negative psych ROS   GI/Hepatic Neg liver ROS, GERD  Medicated,  Endo/Other  negative endocrine ROS  Renal/GU negative Renal ROS  negative genitourinary   Musculoskeletal   Abdominal   Peds  Hematology negative hematology ROS (+)   Anesthesia Other Findings Chiari Malformation  Reproductive/Obstetrics negative OB ROS                             Anesthesia Physical Anesthesia Plan  ASA: 3  Anesthesia Plan: General   Post-op Pain Management:    Induction:   PONV Risk Score and Plan: Propofol infusion  Airway Management Planned:   Additional Equipment:   Intra-op Plan:   Post-operative Plan:   Informed Consent: I have reviewed the patients History and Physical, chart, labs and discussed the procedure including the risks, benefits and alternatives for the proposed anesthesia with the patient or authorized representative who has indicated his/her understanding and acceptance.     Dental Advisory Given  Plan Discussed with: CRNA  Anesthesia Plan Comments:         Anesthesia Quick Evaluation

## 2021-04-07 NOTE — Discharge Instructions (Addendum)
EGD Discharge instructions Please read the instructions outlined below and refer to this sheet in the next few weeks. These discharge instructions provide you with general information on caring for yourself after you leave the hospital. Your doctor may also give you specific instructions. While your treatment has been planned according to the most current medical practices available, unavoidable complications occasionally occur. If you have any problems or questions after discharge, please call your doctor. ACTIVITY You may resume your regular activity but move at a slower pace for the next 24 hours.  Take frequent rest periods for the next 24 hours.  Walking will help expel (get rid of) the air and reduce the bloated feeling in your abdomen.  No driving for 24 hours (because of the anesthesia (medicine) used during the test).  You may shower.  Do not sign any important legal documents or operate any machinery for 24 hours (because of the anesthesia used during the test).  NUTRITION Drink plenty of fluids.  You may resume your normal diet.  Begin with a light meal and progress to your normal diet.  Avoid alcoholic beverages for 24 hours or as instructed by your caregiver.  MEDICATIONS You may resume your normal medications unless your caregiver tells you otherwise.  WHAT YOU CAN EXPECT TODAY You may experience abdominal discomfort such as a feeling of fullness or "gas" pains.  FOLLOW-UP Your doctor will discuss the results of your test with you.  SEEK IMMEDIATE MEDICAL ATTENTION IF ANY OF THE FOLLOWING OCCUR: Excessive nausea (feeling sick to your stomach) and/or vomiting.  Severe abdominal pain and distention (swelling).  Trouble swallowing.  Temperature over 101 F (37.8 C).  Rectal bleeding or vomiting of blood.    Your EGD revealed a moderate amount of inflammation in your stomach including a few small ulcers.  I took biopsies of this to rule out infection with a bacteria called H.  pylori.  I did stretch your esophagus, hopefully this helps with your swallowing.  I also took biopsies of your esophagus to rule out a condition called eosinophilic esophagitis.    At the upper portion of your esophagus there was a polyp/lesion of unknown significance.  I took biopsies of this as well.  You may require further evaluation with endoscopic ultrasound but we will wait on pathology results first.    I am going to start you on a new medication called omeprazole 20 mg twice daily.  This medication works best if you take it 30 minutes for breakfast and 30 minutes for dinner.  This will help heal the the ulcers seen today.  I hope you have a great rest of your week!  Elon Alas. Abbey Chatters, D.O. Gastroenterology and Hepatology [facility]

## 2021-04-11 LAB — SURGICAL PATHOLOGY

## 2021-04-13 ENCOUNTER — Encounter (HOSPITAL_COMMUNITY): Payer: Self-pay | Admitting: Internal Medicine

## 2021-04-18 ENCOUNTER — Telehealth: Payer: Self-pay | Admitting: Internal Medicine

## 2021-04-18 NOTE — Telephone Encounter (Signed)
The results have not been sent out to me yet.  Phoned and LMOVM for the pt that her results where not in but when they get here I will call her

## 2021-04-18 NOTE — Telephone Encounter (Signed)
Pt called to see if her procedure results are back yet. [phone]

## 2021-05-04 ENCOUNTER — Telehealth: Payer: Self-pay

## 2021-05-04 ENCOUNTER — Telehealth: Payer: Self-pay | Admitting: Internal Medicine

## 2021-05-04 NOTE — Telephone Encounter (Signed)
I called and spoke with patient today.  States her swallowing is mildly improved on omeprazole 20 mg twice daily.  Went over biopsy results with her.  Soft tissue mass/nodule in her esophagus showed severe esophagitis with ulceration, negative for viral and fungal findings.  I will reach out to Dr. Ardis Hughs and Dr. Rush Landmark to see if EUS indicated versus potential repeat EGD.  Patient is agreeable.  Thank you

## 2021-05-04 NOTE — Telephone Encounter (Signed)
Sent Dr. Abbey Chatters a note already

## 2021-05-04 NOTE — Telephone Encounter (Signed)
Pt had procedure on 9/9 and hasn't heard from her results. Please call [phone]

## 2021-05-04 NOTE — Telephone Encounter (Signed)
Communication noted.  

## 2021-05-04 NOTE — Telephone Encounter (Signed)
Pt phoned wanting her results of procedure from 9/09. Please advise

## 2021-05-25 ENCOUNTER — Telehealth: Payer: Self-pay | Admitting: Internal Medicine

## 2021-05-25 DIAGNOSIS — R1319 Other dysphagia: Secondary | ICD-10-CM

## 2021-05-25 DIAGNOSIS — K219 Gastro-esophageal reflux disease without esophagitis: Secondary | ICD-10-CM

## 2021-05-25 MED ORDER — OMEPRAZOLE 40 MG PO CPDR: 40.0000 mg | DELAYED_RELEASE_CAPSULE | Freq: Two times a day (BID) | ORAL | 5 refills | Status: DC

## 2021-05-25 NOTE — Addendum Note (Signed)
Addended by: Cheron Every on: 05/25/2021 10:50 AM   Modules accepted: Orders

## 2021-05-25 NOTE — Telephone Encounter (Signed)
See prior note

## 2021-05-25 NOTE — Telephone Encounter (Signed)
Pt returning call. [phone]

## 2021-05-25 NOTE — Telephone Encounter (Signed)
Case discussed with Dr. Ardis Hughs and Dr. Rush Landmark.  Recommended maximize PPI and repeat upper endoscopy.  She is currently on omeprazole twice daily.  Can we schedule her for repeat EGD in 3 to 4 weeks?  Diagnosis dysphagia, GERD, soft tissue mass of the esophagus.  ASA 2.  Thank you

## 2021-05-25 NOTE — Telephone Encounter (Signed)
Patient returned call. She has been scheduled for 11/29 at 9:30am. Aware will need preg test day prior. Advised will mail instructions.

## 2021-05-25 NOTE — Telephone Encounter (Signed)
LMOVM for pt to return call 

## 2021-06-26 ENCOUNTER — Other Ambulatory Visit (HOSPITAL_COMMUNITY)
Admission: RE | Admit: 2021-06-26 | Discharge: 2021-06-26 | Disposition: A | Discharge status: Home / Self Care | Payer: Medicaid Other | Source: Ambulatory Visit | Attending: Internal Medicine | Admitting: Internal Medicine

## 2021-06-26 DIAGNOSIS — R1319 Other dysphagia: Secondary | ICD-10-CM | POA: Diagnosis present

## 2021-06-26 DIAGNOSIS — K219 Gastro-esophageal reflux disease without esophagitis: Secondary | ICD-10-CM | POA: Insufficient documentation

## 2021-06-26 LAB — PREGNANCY, URINE: Preg Test, Ur: NEGATIVE

## 2021-06-27 ENCOUNTER — Encounter (HOSPITAL_COMMUNITY)
Admission: RE | Disposition: A | Discharge status: Home / Self Care | Payer: Self-pay | Source: Home / Self Care | Attending: Internal Medicine

## 2021-06-27 ENCOUNTER — Ambulatory Visit (HOSPITAL_COMMUNITY): Payer: Medicaid Other | Admitting: Certified Registered Nurse Anesthetist

## 2021-06-27 ENCOUNTER — Encounter (HOSPITAL_COMMUNITY): Payer: Self-pay

## 2021-06-27 ENCOUNTER — Ambulatory Visit (HOSPITAL_COMMUNITY)
Admission: RE | Admit: 2021-06-27 | Discharge: 2021-06-27 | Disposition: A | Discharge status: Home / Self Care | Payer: Medicaid Other | Attending: Internal Medicine | Admitting: Internal Medicine

## 2021-06-27 ENCOUNTER — Other Ambulatory Visit: Payer: Self-pay

## 2021-06-27 DIAGNOSIS — Z87891 Personal history of nicotine dependence: Secondary | ICD-10-CM | POA: Insufficient documentation

## 2021-06-27 DIAGNOSIS — K219 Gastro-esophageal reflux disease without esophagitis: Secondary | ICD-10-CM | POA: Diagnosis not present

## 2021-06-27 DIAGNOSIS — Z87728 Personal history of other specified (corrected) congenital malformations of nervous system and sense organs: Secondary | ICD-10-CM | POA: Diagnosis not present

## 2021-06-27 DIAGNOSIS — K2289 Other specified disease of esophagus: Secondary | ICD-10-CM | POA: Diagnosis not present

## 2021-06-27 DIAGNOSIS — D490 Neoplasm of unspecified behavior of digestive system: Secondary | ICD-10-CM

## 2021-06-27 DIAGNOSIS — R1310 Dysphagia, unspecified: Secondary | ICD-10-CM

## 2021-06-27 DIAGNOSIS — Z79899 Other long term (current) drug therapy: Secondary | ICD-10-CM | POA: Insufficient documentation

## 2021-06-27 HISTORY — PX: ESOPHAGOGASTRODUODENOSCOPY (EGD) WITH PROPOFOL: SHX5813

## 2021-06-27 HISTORY — PX: BIOPSY: SHX5522

## 2021-06-27 SURGERY — ESOPHAGOGASTRODUODENOSCOPY (EGD) WITH PROPOFOL
Anesthesia: General

## 2021-06-27 MED ORDER — PROPOFOL 10 MG/ML IV BOLUS
INTRAVENOUS | Status: DC | PRN
  Administered 2021-06-27: 100 mg via INTRAVENOUS
  Administered 2021-06-27: 50 mg via INTRAVENOUS

## 2021-06-27 MED ORDER — LACTATED RINGERS IV SOLN: INTRAVENOUS | Status: DC

## 2021-06-27 MED ORDER — LIDOCAINE HCL (CARDIAC) PF 100 MG/5ML IV SOSY
PREFILLED_SYRINGE | INTRAVENOUS | Status: DC | PRN
  Administered 2021-06-27: 50 mg via INTRAVENOUS

## 2021-06-27 NOTE — Discharge Instructions (Signed)
EGD Discharge instructions Please read the instructions outlined below and refer to this sheet in the next few weeks. These discharge instructions provide you with general information on caring for yourself after you leave the hospital. Your doctor may also give you specific instructions. While your treatment has been planned according to the most current medical practices available, unavoidable complications occasionally occur. If you have any problems or questions after discharge, please call your doctor. ACTIVITY You may resume your regular activity but move at a slower pace for the next 24 hours.  Take frequent rest periods for the next 24 hours.  Walking will help expel (get rid of) the air and reduce the bloated feeling in your abdomen.  No driving for 24 hours (because of the anesthesia (medicine) used during the test).  You may shower.  Do not sign any important legal documents or operate any machinery for 24 hours (because of the anesthesia used during the test).  NUTRITION Drink plenty of fluids.  You may resume your normal diet.  Begin with a light meal and progress to your normal diet.  Avoid alcoholic beverages for 24 hours or as instructed by your caregiver.  MEDICATIONS You may resume your normal medications unless your caregiver tells you otherwise.  WHAT YOU CAN EXPECT TODAY You may experience abdominal discomfort such as a feeling of fullness or "gas" pains.  FOLLOW-UP Your doctor will discuss the results of your test with you.  SEEK IMMEDIATE MEDICAL ATTENTION IF ANY OF THE FOLLOWING OCCUR: Excessive nausea (feeling sick to your stomach) and/or vomiting.  Severe abdominal pain and distention (swelling).  Trouble swallowing.  Temperature over 101 F (37.8 C).  Rectal bleeding or vomiting of blood.    Your EGD again showed the small mass in your esophagus.  Does not appear improved despite aggressive PPI therapy.  I did take mopre biopsies of this today.  Once I have  these results, I will reach out to advanced endoscopy in [facility] to see what the next step in treatment is.  You may require endoscopic ultrasound.  I will be in touch with you once I have spoken with them and pathology results have been finalized.  Continue on twice daily omeprazole for now.  I hope you have a great rest of your week!  Elon Alas. Abbey Chatters, D.O. Gastroenterology and Hepatology [facility]

## 2021-06-27 NOTE — Transfer of Care (Signed)
Immediate Anesthesia Transfer of Care Note  Patient: Melanie Thomas  Procedure(s) Performed: ESOPHAGOGASTRODUODENOSCOPY (EGD) WITH PROPOFOL BIOPSY  Patient Location: Endoscopy Unit  Anesthesia Type:General  Level of Consciousness: awake, alert  and oriented  Airway & Oxygen Therapy: Patient Spontanous Breathing  Post-op Assessment: Report given to RN and Post -op Vital signs reviewed and stable  Post vital signs: Reviewed and stable  Last Vitals:  Vitals Value Taken Time  BP    Temp    Pulse    Resp    SpO2      Last Pain:  Vitals:   06/27/21 0826  TempSrc: Oral  PainSc:       Patients Stated Pain Goal: 7 (75/88/32 5498)  Complications: No notable events documented.

## 2021-06-27 NOTE — H&P (Signed)
Primary Care Physician:  Sharilyn Sites, MD Primary Gastroenterologist:  Dr. Abbey Chatters  Pre-Procedure History & Physical: HPI:  Melanie Thomas is a 35 y.o. female is here for an EGD for previously noted  soft tissue mass of the esophagus, biopsy showed severe acute nonspecific esophagitis with ulceration and granulation tissue.  She has been on twice daily PPI since previous EGD 04/07/2021.  Past Medical History:  Diagnosis Date   Chiari malformation    GERD (gastroesophageal reflux disease)    History of angioedema    PONV (postoperative nausea and vomiting)    POTS (postural orthostatic tachycardia syndrome)     Past Surgical History:  Procedure Laterality Date   ANAL FISSURE REPAIR N/A 11/21/2012   Procedure: ANAL FISSURECTOMY;  Surgeon: Jamesetta So, MD;  Location: [facility];  Service: General;  Laterality: N/A;   BALLOON DILATION N/A 04/07/2021   Procedure: [place];  Surgeon: Eloise Harman, DO;  Location: [facility];  Service: Endoscopy;  Laterality: N/A;   BIOPSY  04/07/2021   Procedure: BIOPSY;  Surgeon: Eloise Harman, DO;  Location: [facility];  Service: Endoscopy;;   CESAREAN SECTION  2006   [facility]   CHOLECYSTECTOMY N/A 08/06/2016   Procedure: LAPAROSCOPIC CHOLECYSTECTOMY;  Surgeon: [name], MD;  Location: [facility];  Service: General;  Laterality: N/A;   CRANIECTOMY [place] / CHIARI Bilateral 02/29/2016   [facility]  2004, 2009   ESOPHAGOGASTRODUODENOSCOPY (EGD) WITH PROPOFOL N/A 04/07/2021   Procedure: ESOPHAGOGASTRODUODENOSCOPY (EGD) WITH PROPOFOL;  Surgeon: Eloise Harman, DO;  Location: [facility];  Service: Endoscopy;  Laterality: N/A;  8:30am    Prior to Admission medications   Medication Sig Start Date End Date Taking? Authorizing Provider  fexofenadine (ALLEGRA) 180 MG tablet Take 180 mg by mouth every evening.   Yes [provider]  levothyroxine (SYNTHROID,  LEVOTHROID) 75 MCG tablet Take 75 mcg by mouth daily before breakfast.   Yes [provider]  naproxen sodium (ANAPROX) 220 MG tablet Take 440 mg by mouth every 12 (twelve) hours as needed (pain).   Yes [provider]  omeprazole (PRILOSEC) 40 MG capsule Take 1 capsule (40 mg total) by mouth 2 (two) times daily. 05/25/21 11/21/21 Yes Fount Bahe, Elon Alas, DO  cyclobenzaprine (FLEXERIL) 10 MG tablet Take 10 mg by mouth 3 (three) times daily as needed for muscle spasms. 11/10/15   [provider]  EPINEPHrine 0.3 mg/0.3 mL IJ SOAJ injection Inject 0.3 mg into the muscle as needed for anaphylaxis.    [provider]  promethazine (PHENERGAN) 25 MG tablet Take 25 mg by mouth every 6 (six) hours as needed for nausea/vomiting. 01/03/21   [provider]  UBRELVY 100 MG TABS Take 100 mg by mouth daily as needed for migraine. 03/08/21   [provider]    Allergies as of 05/25/2021 - Review Complete 04/07/2021  Allergen Reaction Noted   Aspirin Anaphylaxis, Rash, and Other (See Comments) 05/14/2012   Aspirin-acetaminophen-caffeine Anaphylaxis 12/12/2015   Shrimp [shellfish allergy] Anaphylaxis, Rash, and Other (See Comments) 05/14/2012   Verapamil Anaphylaxis 01/10/2016   Excedrin tension headache [acetaminophen-caffeine] Swelling 05/14/2012   Ibuprofen Swelling 05/14/2012   Other  05/14/2012   Gabapentin Swelling and Hives 12/12/2015   Ketorolac Rash 11/07/2015   Latex Swelling and Rash 11/13/2012    Family History  Problem Relation Age of Onset   Hypothyroidism Other    Hypertension Other  Diabetes Other    Breast cancer Other    Heart disease Other    Hypertension Other    Diabetes Other    Hypertension Other    Diabetes Other    Breast cancer Other    Hypertension Other    Diabetes Other    Hypothyroidism Mother     Social History   Socioeconomic History   Marital status: Legally Separated    Spouse name: Not on file   Number  of children: Not on file   Years of education: Not on file   Highest education level: Not on file  Occupational History   Not on file  Tobacco Use   Smoking status: Former    Packs/day: 0.50    Years: 2.00    Pack years: 1.00    Types: Cigarettes    Quit date: 08/03/2007    Years since quitting: 13.9   Smokeless tobacco: Never  Vaping Use   Vaping Use: Never used  Substance and Sexual Activity   Alcohol use: Yes    Comment: rare use   Drug use: No   Sexual activity: Yes  Other Topics Concern   Not on file  Social History Narrative   Not on file   Social Determinants of Health   Financial Resource Strain: Not on file  Food Insecurity: Not on file  Transportation Needs: Not on file  Physical Activity: Not on file  Stress: Not on file  Social Connections: Not on file  Intimate Partner Violence: Not on file    Review of Systems: See HPI, otherwise negative ROS  Physical Exam: Vital signs in last 24 hours: Temp:  [98 F (36.7 C)] 98 F (36.7 C) (11/29 0826) Resp:  [18] 18 (11/29 0826) BP: (121)/(85) 121/85 (11/29 0826) SpO2:  [100 %] 100 % (11/29 0826) Weight:  [93 kg] 93 kg (11/29 0823)   General:   Alert,  Well-developed, well-nourished, pleasant and cooperative in NAD Head:  Normocephalic and atraumatic. Eyes:  Sclera clear, no icterus.   Conjunctiva pink. Ears:  Normal auditory acuity. Nose:  No deformity, discharge,  or lesions. Mouth:  No deformity or lesions, dentition normal. Neck:  Supple; no masses or thyromegaly. Lungs:  Clear throughout to auscultation.   No wheezes, crackles, or rhonchi. No acute distress. Heart:  Regular rate and rhythm; no murmurs, clicks, rubs,  or gallops. Abdomen:  Soft, nontender and nondistended. No masses, hepatosplenomegaly or hernias noted. Normal bowel sounds, without guarding, and without rebound.   Msk:  Symmetrical without gross deformities. Normal posture. Extremities:  Without clubbing or edema. Neurologic:  Alert  and  oriented x4;  grossly normal neurologically. Skin:  Intact without significant lesions or rashes. Cervical Nodes:  No significant cervical adenopathy. Psych:  Alert and cooperative. Normal mood and affect.  Impression/Plan: Melanie Thomas is here for an EGD for previously noted  soft tissue mass of the esophagus, biopsy showed severe acute nonspecific esophagitis with ulceration and granulation tissue.  She has been on twice daily PPI since previous EGD 04/07/2021.  The risks of the procedure including infection, bleed, or perforation as well as benefits, limitations, alternatives and imponderables have been reviewed with the patient. Questions have been answered. All parties agreeable.

## 2021-06-27 NOTE — Op Note (Signed)
[facility] Patient Name: Melanie Thomas Procedure Date: 06/27/2021 10:09 AM MRN: [id number] Date of Birth: 09-01-1985 Attending MD: Elon Alas. Abbey Chatters DO CSN: [id number] Age: 35 Admit Type: Outpatient Procedure:                Upper GI endoscopy Indications:              Dysphagia, soft tissue mass of esophagus, GERD Providers:                Elon Alas. Abbey Chatters, DO, Janeece Riggers, RN, Casimer Bilis, Technician Referring MD:              Medicines:                See the Anesthesia note for documentation of the                            administered medications Complications:            No immediate complications. Estimated Blood Loss:     Estimated blood loss was minimal. Procedure:                Pre-Anesthesia Assessment:                           - The anesthesia plan was to use monitored                            anesthesia care (MAC).                           After obtaining informed consent, the endoscope was                            passed under direct vision. Throughout the                            procedure, the patient's blood pressure, pulse, and                            oxygen saturations were monitored continuously. The                            [id number]-H190 ([id number]) scope was introduced through the                            mouth, and advanced to the second part of duodenum.                            The upper GI endoscopy was accomplished without                            difficulty. The patient tolerated the procedure  well. Scope In: 10:18:53 AM Scope Out: 10:22:04 AM Total Procedure Duration: 0 hours 3 minutes 11 seconds  Findings:      A 10-15 mm, soft tissue mass with no bleeding and no stigmata of recent       bleeding was found in the proximal esophagus, 18 cm from the incisors.       The mass was non-obstructing and not circumferential. Biopsies were       taken with a cold forceps  for histology.      The entire examined stomach was normal.      The duodenal bulb, first portion of the duodenum and second portion of       the duodenum were normal. 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 Biopsied.                           - Normal stomach.                           - Normal duodenal bulb, first portion of the                            duodenum and second portion of the duodenum. Moderate Sedation:      Per Anesthesia Care Recommendation:           - Patient has a contact number available for                            emergencies. The signs and symptoms of potential                            delayed complications were discussed with the                            patient. Return to normal activities tomorrow.                            Written discharge instructions were provided to the                            patient.                           - Resume previous diet.                           - Continue present medications.                           - Await pathology results.                           - Mass does not appear improved despite aggressive  BID PPI therapy for 2-3 months. Will await biopsy                            results. May need referral for EUS. Will discuss                            further with advanced [facility]o in [facility] after                            pathology reviewed. Procedure Code(s):        --- Professional ---                           [phone], Esophagogastroduodenoscopy, flexible,                            transoral; with biopsy, single or multiple Diagnosis Code(s):        --- Professional ---                           D49.0, Neoplasm of unspecified behavior of                            digestive system                           R13.10, Dysphagia, unspecified CPT copyright 2019 American Medical Association. All rights  reserved. The codes documented in this report are preliminary and upon coder review may  be revised to meet current compliance requirements. Elon Alas. Abbey Chatters, DO [place] Abbey Chatters, DO 06/27/2021 10:29:43 AM This report has been signed electronically. Number of Addenda: 0

## 2021-06-27 NOTE — Anesthesia Postprocedure Evaluation (Signed)
Anesthesia Post Note  Patient: Melanie Thomas  Procedure(s) Performed: ESOPHAGOGASTRODUODENOSCOPY (EGD) WITH PROPOFOL BIOPSY  Patient location during evaluation: Phase II Anesthesia Type: General Level of consciousness: awake Pain management: pain level controlled Vital Signs Assessment: post-procedure vital signs reviewed and stable Respiratory status: spontaneous breathing and respiratory function stable Cardiovascular status: blood pressure returned to baseline and stable Postop Assessment: no headache and no apparent nausea or vomiting Anesthetic complications: no Comments: Late entry   No notable events documented.   Last Vitals:  Vitals:   06/27/21 0826 06/27/21 1025  BP: 121/85 (!) 102/49  Pulse:  84  Resp: 18 16  Temp: 36.7 C 36.7 C  SpO2: 100% 100%    Last Pain:  Vitals:   06/27/21 1025  TempSrc: Oral  PainSc: 0-No pain                 Louann Sjogren

## 2021-06-27 NOTE — Anesthesia Preprocedure Evaluation (Signed)
Anesthesia Evaluation  Patient identified by MRN, date of birth, ID band Patient awake    Reviewed: Allergy & Precautions, H&P , NPO status , Patient's Chart, lab work & pertinent test results, reviewed documented beta blocker date and time   History of Anesthesia Complications (+) PONV and history of anesthetic complications  Airway Mallampati: II  TM Distance: >3 FB Neck ROM: full    Dental no notable dental hx.    Pulmonary neg pulmonary ROS, former smoker,    Pulmonary exam normal breath sounds clear to auscultation       Cardiovascular Exercise Tolerance: Good negative cardio ROS   Rhythm:regular Rate:Normal     Neuro/Psych  Headaches, negative psych ROS   GI/Hepatic Neg liver ROS, GERD  Medicated,  Endo/Other  negative endocrine ROS  Renal/GU negative Renal ROS  negative genitourinary   Musculoskeletal   Abdominal   Peds  Hematology negative hematology ROS (+)   Anesthesia Other Findings Chiari Malformation  Reproductive/Obstetrics negative OB ROS                             Anesthesia Physical  Anesthesia Plan  ASA: 3  Anesthesia Plan: General   Post-op Pain Management:    Induction:   PONV Risk Score and Plan: Propofol infusion  Airway Management Planned:   Additional Equipment:   Intra-op Plan:   Post-operative Plan:   Informed Consent: I have reviewed the patients History and Physical, chart, labs and discussed the procedure including the risks, benefits and alternatives for the proposed anesthesia with the patient or authorized representative who has indicated his/her understanding and acceptance.     Dental Advisory Given  Plan Discussed with: CRNA  Anesthesia Plan Comments:         Anesthesia Quick Evaluation

## 2021-07-03 LAB — SURGICAL PATHOLOGY

## 2021-07-04 ENCOUNTER — Encounter (HOSPITAL_COMMUNITY): Payer: Self-pay | Admitting: Internal Medicine

## 2021-07-10 ENCOUNTER — Telehealth: Payer: Self-pay | Admitting: Internal Medicine

## 2021-07-10 NOTE — Telephone Encounter (Signed)
PATIENT CALLED ASKING ABOUT HER PROCEDURE RESULTS, PLEASE CALL IF THEY ARE READY

## 2021-07-10 NOTE — Telephone Encounter (Signed)
Hey, The pt has seen her results in [place], but wants results from [place]. Please advise.

## 2021-07-12 ENCOUNTER — Telehealth: Payer: Self-pay | Admitting: Internal Medicine

## 2021-07-12 DIAGNOSIS — K2289 Other specified disease of esophagus: Secondary | ICD-10-CM

## 2021-07-12 NOTE — Addendum Note (Signed)
Addended by: Cheron Every on: 07/12/2021 03:23 PM   Modules accepted: Orders

## 2021-07-12 NOTE — Telephone Encounter (Signed)
PA for CT Chest submitted via [employer]. Auth approved. Auth# [id number], DOS 07/12/2021-09/09/2021

## 2021-07-12 NOTE — Telephone Encounter (Signed)
I discussed patient's biopsies and case further with Dr. Mansouraty/Dr. Ardis Hughs.  Recommended CT chest to further evaluate as well as endoscopic ultrasound.  Can we schedule her for CT chest with contrast, diagnosis esophageal mass  [facility] will be reaching out to her to set up EUS.  All questions concerns from patient have been answered. She is amenable to the plan.

## 2021-07-12 NOTE — Telephone Encounter (Signed)
Called pt and is aware of appt details.

## 2021-07-13 ENCOUNTER — Telehealth: Payer: Self-pay

## 2021-07-13 ENCOUNTER — Ambulatory Visit (HOSPITAL_COMMUNITY)
Admission: RE | Admit: 2021-07-13 | Discharge: 2021-07-13 | Disposition: A | Discharge status: Home / Self Care | Payer: Medicaid Other | Source: Ambulatory Visit | Attending: Internal Medicine | Admitting: Internal Medicine

## 2021-07-13 ENCOUNTER — Other Ambulatory Visit: Payer: Self-pay

## 2021-07-13 DIAGNOSIS — K2289 Other specified disease of esophagus: Secondary | ICD-10-CM | POA: Diagnosis not present

## 2021-07-13 DIAGNOSIS — K209 Esophagitis, unspecified without bleeding: Secondary | ICD-10-CM

## 2021-07-13 DIAGNOSIS — R1319 Other dysphagia: Secondary | ICD-10-CM

## 2021-07-13 DIAGNOSIS — K2210 Ulcer of esophagus without bleeding: Secondary | ICD-10-CM

## 2021-07-13 MED ORDER — IOHEXOL 300 MG/ML  SOLN
75.0000 mL | Freq: Once | INTRAMUSCULAR | Status: AC | PRN
  Administered 2021-07-13: 75 mL via INTRAVENOUS

## 2021-07-13 NOTE — Telephone Encounter (Signed)
----- Message from Irving Copas., MD sent at 07/13/2021  5:21 AM EST ----- KC, Sounds like a plan. Nasiah Lehenbauer, please move forward with scheduling this patient an upper EUS with DJ or myself. This will be done after the patient's CT chest has been completed. Thanks. GM ----- Message ----- From: Eloise Harman, DO Sent: 07/12/2021   2:05 PM EST To: Milus Banister, MD, #  Thanks for the feedback guys.  I just talked with patient and she is agreeable to both CT and EUS.  I will have my office set up CT chest here locally and will forward you the results once it is completed.  I counseled her that your office will be reaching out to her in the near future to set up an EUS and she is agreeable.  [place]    ----- Message ----- From: Irving Copas., MD Sent: 07/12/2021  12:16 PM EST To: Milus Banister, MD, Eloise Harman, DO  [facility], Thanks for reaching out. Certainly looks different than HSV esophagitis would. I think it would be reasonable to perform an endoscopic ultrasound to evaluate the area and decide how deep it is. I think getting a CT chest to evaluate whether anything is being seen compressing the esophagus or potentially from the esophageal wall would be helpful before the EUS. If the patient and you agree then reply back and we can work on getting scheduled with DJ or myself. Thanks. GM  ----- Message ----- From: Eloise Harman, DO Sent: 07/12/2021  12:11 PM EST To: Milus Banister, MD, #  Jannifer Rodney and DJ,  Just wanted to follow-up with you guys on this patient.   She is a 35 year old female that presented with dysphagia.  She underwent EGD 04/07/2021 which showed soft tissue mass in the proximal esophagus at 18 cm.  Biopsies showed severe acute nonspecific esophagitis with ulceration and granulation.  I initially discussed this case with you guys and we decided to put her on aggressive PPI therapy.  She has been on omeprazole 40 mg twice daily since that  procedure (over 3 months).  Notes her reflux is better though she continues to have dysphagia/globus sensation.  I repeated her EGD 06/27/2021 which again showed similar findings (procedure report in chart).  Appeared the mass may have been slightly larger.  I took more extensive biopsies this go around.  Path below  A. ESOPHAGUS, BIOPSY:  - Polypoid fragments of granulation tissue with overlying  fibrinopurulent material cannot exclude HSV esophagitis.  - See comment.  - Negative for dysplasia and malignancy.   COMMENT:   Sections demonstrate polypoid fragments of markedly inflamed granulation  tissue with overlying fibrinopurulent material consistent with  ulceration. Squamous epithelium is not present. IHC for HSV I was  performed twice and is interpreted as equivocal. Depending on clinical  impression, additional testing to exclude HSV esophagitis is  recommended.  PASF stain is negative for fungus/yeast. IHC for CMV and HSV II are  negative. EBER ISH is negative. Stain controls worked appropriately.   I discussed further with patient.  She has no history of HSV in the past.  No real risk factors for it either.  I have seen HSV esophagitis before and it did not look like this.  She did not have any ulcers elsewhere in her esophagus.  Just wanted to get your guys input on potential next steps?  Still hold off on EUS?  As always, thank you guys for your insight.  Abbey Chatters

## 2021-07-13 NOTE — Telephone Encounter (Signed)
The pt has been scheduled for EUS on 08/22/21 at 730 am at [facility] with DJ.    EUS scheduled, pt instructed and medications reviewed.  Patient instructions mailed to home and sent to My Chart .  Patient to call with any questions or concerns.

## 2021-08-11 ENCOUNTER — Encounter (HOSPITAL_COMMUNITY): Payer: Self-pay | Admitting: Gastroenterology

## 2021-08-11 NOTE — Progress Notes (Signed)
Attempted to obtain medical history via telephone, unable to reach at this time. I left a voicemail to return pre surgical testing department's phone call.  

## 2021-08-21 NOTE — Anesthesia Preprocedure Evaluation (Addendum)
Anesthesia Evaluation  Patient identified by MRN, date of birth, ID band Patient awake    Reviewed: Allergy & Precautions, NPO status , Patient's Chart, lab work & pertinent test results  History of Anesthesia Complications (+) PONV and history of anesthetic complications  Airway Mallampati: I  TM Distance: >3 FB Neck ROM: Full    Dental no notable dental hx.    Pulmonary former smoker,    Pulmonary exam normal breath sounds clear to auscultation       Cardiovascular negative cardio ROS Normal cardiovascular exam Rhythm:Regular Rate:Normal     Neuro/Psych  Headaches, negative psych ROS   GI/Hepatic Neg liver ROS, GERD  Medicated and Controlled,  Endo/Other  Hypothyroidism   Renal/GU negative Renal ROS     Musculoskeletal negative musculoskeletal ROS (+)   Abdominal   Peds  Hematology negative hematology ROS (+)   Anesthesia Other Findings esophageal mass  esophagitis esophageal ulcer  Reproductive/Obstetrics                            Anesthesia Physical Anesthesia Plan  ASA: 2  Anesthesia Plan: MAC   Post-op Pain Management:    Induction: Intravenous  PONV Risk Score and Plan: 3 and Propofol infusion and Treatment may vary due to age or medical condition  Airway Management Planned: Nasal Cannula  Additional Equipment:   Intra-op Plan:   Post-operative Plan:   Informed Consent: I have reviewed the patients History and Physical, chart, labs and discussed the procedure including the risks, benefits and alternatives for the proposed anesthesia with the patient or authorized representative who has indicated his/her understanding and acceptance.     Dental advisory given  Plan Discussed with: CRNA  Anesthesia Plan Comments:         Anesthesia Quick Evaluation

## 2021-08-22 ENCOUNTER — Ambulatory Visit (HOSPITAL_COMMUNITY)
Admission: RE | Admit: 2021-08-22 | Discharge: 2021-08-22 | Disposition: A | Discharge status: Home / Self Care | Payer: Medicaid Other | Attending: Gastroenterology | Admitting: Gastroenterology

## 2021-08-22 ENCOUNTER — Encounter (HOSPITAL_COMMUNITY)
Admission: RE | Disposition: A | Discharge status: Home / Self Care | Payer: Self-pay | Source: Home / Self Care | Attending: Gastroenterology

## 2021-08-22 ENCOUNTER — Encounter (HOSPITAL_COMMUNITY): Payer: Self-pay | Admitting: Gastroenterology

## 2021-08-22 ENCOUNTER — Other Ambulatory Visit: Payer: Self-pay

## 2021-08-22 ENCOUNTER — Ambulatory Visit (HOSPITAL_COMMUNITY): Payer: Medicaid Other | Admitting: Anesthesiology

## 2021-08-22 DIAGNOSIS — K2289 Other specified disease of esophagus: Secondary | ICD-10-CM | POA: Diagnosis not present

## 2021-08-22 DIAGNOSIS — K219 Gastro-esophageal reflux disease without esophagitis: Secondary | ICD-10-CM | POA: Diagnosis not present

## 2021-08-22 DIAGNOSIS — R1310 Dysphagia, unspecified: Secondary | ICD-10-CM | POA: Diagnosis present

## 2021-08-22 DIAGNOSIS — R1319 Other dysphagia: Secondary | ICD-10-CM

## 2021-08-22 DIAGNOSIS — K2210 Ulcer of esophagus without bleeding: Secondary | ICD-10-CM

## 2021-08-22 DIAGNOSIS — E039 Hypothyroidism, unspecified: Secondary | ICD-10-CM | POA: Insufficient documentation

## 2021-08-22 DIAGNOSIS — K2281 Esophageal polyp: Secondary | ICD-10-CM | POA: Diagnosis not present

## 2021-08-22 DIAGNOSIS — K209 Esophagitis, unspecified without bleeding: Secondary | ICD-10-CM

## 2021-08-22 DIAGNOSIS — Z87891 Personal history of nicotine dependence: Secondary | ICD-10-CM | POA: Insufficient documentation

## 2021-08-22 HISTORY — PX: EUS: SHX5427

## 2021-08-22 HISTORY — PX: ESOPHAGOGASTRODUODENOSCOPY: SHX5428

## 2021-08-22 HISTORY — PX: BIOPSY: SHX5522

## 2021-08-22 LAB — PREGNANCY, URINE: Preg Test, Ur: NEGATIVE

## 2021-08-22 SURGERY — EGD (ESOPHAGOGASTRODUODENOSCOPY)
Anesthesia: Monitor Anesthesia Care

## 2021-08-22 MED ORDER — PROPOFOL 1000 MG/100ML IV EMUL
INTRAVENOUS | Status: AC
  Filled 2021-08-22: qty 100

## 2021-08-22 MED ORDER — PROPOFOL 10 MG/ML IV BOLUS
INTRAVENOUS | Status: AC
  Filled 2021-08-22: qty 20

## 2021-08-22 MED ORDER — LACTATED RINGERS IV SOLN
INTRAVENOUS | Status: AC | PRN
  Administered 2021-08-22: 1000 mL via INTRAVENOUS

## 2021-08-22 MED ORDER — LIDOCAINE 2% (20 MG/ML) 5 ML SYRINGE
INTRAMUSCULAR | Status: DC | PRN
  Administered 2021-08-22: 100 mg via INTRAVENOUS

## 2021-08-22 MED ORDER — ONDANSETRON HCL 4 MG/2ML IJ SOLN
INTRAMUSCULAR | Status: DC | PRN
Start: 2021-08-22 — End: 2021-08-22
  Administered 2021-08-22: 4 mg via INTRAVENOUS

## 2021-08-22 MED ORDER — PHENYLEPHRINE 40 MCG/ML (10ML) SYRINGE FOR IV PUSH (FOR BLOOD PRESSURE SUPPORT)
PREFILLED_SYRINGE | INTRAVENOUS | Status: DC | PRN
  Administered 2021-08-22 (×3): 40 ug via INTRAVENOUS

## 2021-08-22 MED ORDER — SODIUM CHLORIDE 0.9 % IV SOLN: INTRAVENOUS | Status: DC

## 2021-08-22 MED ORDER — PROPOFOL 10 MG/ML IV BOLUS
INTRAVENOUS | Status: DC | PRN
  Administered 2021-08-22 (×3): 20 mg via INTRAVENOUS

## 2021-08-22 MED ORDER — EPHEDRINE SULFATE-NACL 50-0.9 MG/10ML-% IV SOSY
PREFILLED_SYRINGE | INTRAVENOUS | Status: DC | PRN
  Administered 2021-08-22 (×2): 5 mg via INTRAVENOUS

## 2021-08-22 MED ORDER — LACTATED RINGERS IV SOLN: INTRAVENOUS | Status: DC | PRN

## 2021-08-22 MED ORDER — PROPOFOL 500 MG/50ML IV EMUL
INTRAVENOUS | Status: DC | PRN
  Administered 2021-08-22: 150 ug/kg/min via INTRAVENOUS

## 2021-08-22 MED ORDER — LACTATED RINGERS IV SOLN: INTRAVENOUS | Status: DC

## 2021-08-22 NOTE — Discharge Instructions (Signed)
YOU HAD AN ENDOSCOPIC PROCEDURE TODAY: Refer to the procedure report and other information in the discharge instructions given to you for any specific questions about what was found during the examination. If this information does not answer your questions, please call [place] office at [phone] to clarify.   YOU SHOULD EXPECT: Some feelings of bloating in the abdomen. Passage of more gas than usual. Walking can help get rid of the air that was put into your GI tract during the procedure and reduce the bloating. If you had a lower endoscopy (such as a colonoscopy or flexible sigmoidoscopy) you may notice spotting of blood in your stool or on the toilet paper. Some abdominal soreness may be present for a day or two, also.  DIET: Your first meal following the procedure should be a light meal and then it is ok to progress to your normal diet. A half-sandwich or bowl of soup is an example of a good first meal. Heavy or fried foods are harder to digest and may make you feel nauseous or bloated. Drink plenty of fluids but you should avoid alcoholic beverages for 24 hours. If you had a esophageal dilation, please see attached instructions for diet.    ACTIVITY: Your care partner should take you home directly after the procedure. You should plan to take it easy, moving slowly for the rest of the day. You can resume normal activity the day after the procedure however YOU SHOULD NOT DRIVE, use power tools, machinery or perform tasks that involve climbing or major physical exertion for 24 hours (because of the sedation medicines used during the test).   SYMPTOMS TO REPORT IMMEDIATELY: A gastroenterologist can be reached at any hour. Please call [phone]  for any of the following symptoms:   Following upper endoscopy (EGD, EUS, ERCP, esophageal dilation) Vomiting of blood or coffee ground material  New, significant abdominal pain  New, significant chest pain or pain under the shoulder blades  Painful or  persistently difficult swallowing  New shortness of breath  Black, tarry-looking or red, bloody stools  FOLLOW UP:  If any biopsies were taken you will be contacted by phone or by letter within the next 1-3 weeks. Call [phone]  if you have not heard about the biopsies in 3 weeks.  Please also call with any specific questions about appointments or follow up tests.  

## 2021-08-22 NOTE — H&P (Signed)
HPI: This is a woman with abnormal esophagus, sent by Dr. Abbey Chatters for further evaluation with EUS   ROS: complete GI ROS as described in HPI, all other review negative.  Constitutional:  No unintentional weight loss   Past Medical History:  Diagnosis Date   Chiari malformation    GERD (gastroesophageal reflux disease)    History of angioedema    PONV (postoperative nausea and vomiting)    POTS (postural orthostatic tachycardia syndrome)     Past Surgical History:  Procedure Laterality Date   ANAL FISSURE REPAIR N/A 11/21/2012   Procedure: ANAL FISSURECTOMY;  Surgeon: Jamesetta So, MD;  Location: [facility];  Service: General;  Laterality: N/A;   BALLOON DILATION N/A 04/07/2021   Procedure: [place];  Surgeon: Eloise Harman, DO;  Location: [facility];  Service: Endoscopy;  Laterality: N/A;   BIOPSY  04/07/2021   Procedure: BIOPSY;  Surgeon: Eloise Harman, DO;  Location: [facility];  Service: Endoscopy;;   BIOPSY  06/27/2021   Procedure: BIOPSY;  Surgeon: Eloise Harman, DO;  Location: [facility];  Service: Endoscopy;;   CESAREAN SECTION  2006   [facility]   CHOLECYSTECTOMY N/A 08/06/2016   Procedure: LAPAROSCOPIC CHOLECYSTECTOMY;  Surgeon: [name], MD;  Location: [facility];  Service: General;  Laterality: N/A;   CRANIECTOMY Cross / CHIARI Bilateral 02/29/2016   [facility]  2004, 2009   ESOPHAGOGASTRODUODENOSCOPY (EGD) WITH PROPOFOL N/A 04/07/2021   Procedure: ESOPHAGOGASTRODUODENOSCOPY (EGD) WITH PROPOFOL;  Surgeon: Eloise Harman, DO;  Location: [facility];  Service: Endoscopy;  Laterality: N/A;  8:30am   ESOPHAGOGASTRODUODENOSCOPY (EGD) WITH PROPOFOL N/A 06/27/2021   Procedure: ESOPHAGOGASTRODUODENOSCOPY (EGD) WITH PROPOFOL;  Surgeon: Eloise Harman, DO;  Location: [facility];  Service: Endoscopy;  Laterality: N/A;  9:30am    Current Facility-Administered Medications   Medication Dose Route Frequency Provider Last Rate Last Admin   0.9 %  sodium chloride infusion   Intravenous Continuous Milus Banister, MD       lactated ringers infusion   Intravenous Continuous Milus Banister, MD       Facility-Administered Medications Ordered in Other Encounters  Medication Dose Route Frequency Provider Last Rate Last Admin   lactated ringers infusion   Intravenous Continuous PRN Eben Burow, CRNA   New Bag at 08/22/21 [phone]    Allergies as of 07/13/2021 - Review Complete 06/27/2021  Allergen Reaction Noted   Aspirin Anaphylaxis, Rash, and Other (See Comments) 05/14/2012   Aspirin-acetaminophen-caffeine Anaphylaxis 12/12/2015   Shrimp [shellfish allergy] Anaphylaxis, Rash, and Other (See Comments) 05/14/2012   Verapamil Anaphylaxis 01/10/2016   Excedrin tension headache [acetaminophen-caffeine] Swelling 05/14/2012   Ibuprofen Swelling 05/14/2012   Other  05/14/2012   Gabapentin Swelling and Hives 12/12/2015   Ketorolac Rash 11/07/2015   Latex Swelling and Rash 11/13/2012    Family History  Problem Relation Age of Onset   Hypothyroidism Other    Hypertension Other    Diabetes Other    Breast cancer Other    Heart disease Other    Hypertension Other    Diabetes Other    Hypertension Other    Diabetes Other    Breast cancer Other    Hypertension Other    Diabetes Other    Hypothyroidism Mother     Social History   Socioeconomic History   Marital status: Legally Separated    Spouse name: Not on file  Number of children: Not on file   Years of education: Not on file   Highest education level: Not on file  Occupational History   Not on file  Tobacco Use   Smoking status: Former    Packs/day: 0.50    Years: 2.00    Pack years: 1.00    Types: Cigarettes    Quit date: 08/03/2007    Years since quitting: 14.0   Smokeless tobacco: Never  Vaping Use   Vaping Use: Never used  Substance and Sexual Activity   Alcohol use: Yes    Comment:  rare use   Drug use: No   Sexual activity: Yes  Other Topics Concern   Not on file  Social History Narrative   Not on file   Social Determinants of Health   Financial Resource Strain: Not on file  Food Insecurity: Not on file  Transportation Needs: Not on file  Physical Activity: Not on file  Stress: Not on file  Social Connections: Not on file  Intimate Partner Violence: Not on file     Physical Exam: BP 132/83    Pulse 85    Temp 98.3 F (36.8 C) (Tympanic)    Resp (!) 22    Ht 5\' 2"  (1.575 m)    Wt 93 kg    SpO2 100%    BMI 37.50 kg/m  Constitutional: generally well-appearing Psychiatric: alert and oriented x3 Abdomen: soft, nontender, nondistended, no obvious ascites, no peritoneal signs, normal bowel sounds No peripheral edema noted in lower extremities  Assessment and plan: 36 y.o. female with abnromal esophagus, abnormal prevertebral soft tissue around the same region, dysphagai  EUS evaluation today.  Please see the "Patient Instructions" section for addition details about the plan.  Owens Loffler, MD [facility] 08/22/2021, 7:10 AM

## 2021-08-22 NOTE — Transfer of Care (Signed)
Immediate Anesthesia Transfer of Care Note  Patient: Melanie Thomas  Procedure(s) Performed: UPPER ENDOSCOPIC ULTRASOUND (EUS) RADIAL ESOPHAGOGASTRODUODENOSCOPY (EGD) BIOPSY  Patient Location: PACU and Endoscopy Unit  Anesthesia Type:MAC  Level of Consciousness: awake, alert  and patient cooperative  Airway & Oxygen Therapy: Patient Spontanous Breathing and Patient connected to face mask oxygen  Post-op Assessment: Report given to RN and Post -op Vital signs reviewed and stable  Post vital signs: Reviewed and stable  Last Vitals:  Vitals Value Taken Time  BP 91/48 08/22/21 0822  Temp    Pulse 74 08/22/21 0823  Resp 21 08/22/21 0823  SpO2 100 % 08/22/21 0823  Vitals shown include unvalidated device data.  Last Pain:  Vitals:   08/22/21 0645  TempSrc: Tympanic  PainSc: 0-No pain         Complications: No notable events documented.

## 2021-08-22 NOTE — Op Note (Signed)
[facility] Patient Name: Melanie Thomas Procedure Date: 08/22/2021 MRN: [id number] Attending MD: Milus Banister , MD Date of Birth: 04-Jul-1986 CSN: [id number] Age: 36 Admit Type: Outpatient Procedure:                Upper EUS Indications:              UGI symptoms including chronic dysphagia led to EGD                            03/2021 Dr. Abbey Chatters; proximal esophagus lesion noted,                            biopsies negative for neoplasm. EGD 05/2021 Dr.                            Abbey Chatters, same lesion noted, possibly larger,                            biospies again negative for neoplasm and negative                            for viral testing. CT scan 06/2021 describes                            "amorphus prevertebral soft tissue" in the same                            region Providers:                Milus Banister, MD, Kary Kos RN, RN, Frazier Richards, Technician Referring MD:             Hurshel Keys, DO Medicines:                Monitored Anesthesia Care Complications:            No immediate complications. Estimated blood loss:                            None. Estimated Blood Loss:     Estimated blood loss: none. Procedure:                Pre-Anesthesia Assessment:                           - Prior to the procedure, a History and Physical                            was performed, and patient medications and                            allergies were reviewed. The patient's tolerance of                            previous anesthesia was also  reviewed. The risks                            and benefits of the procedure and the sedation                            options and risks were discussed with the patient.                            All questions were answered, and informed consent                            was obtained. Prior Anticoagulants: The patient has                            taken no previous anticoagulant or  antiplatelet                            agents. ASA Grade Assessment: II - A patient with                            mild systemic disease. After reviewing the risks                            and benefits, the patient was deemed in                            satisfactory condition to undergo the procedure.                           After obtaining informed consent, the endoscope was                            passed under direct vision. Throughout the                            procedure, the patient's blood pressure, pulse, and                            oxygen saturations were monitored continuously. The                            [id number]-[id number] ([id number]) Olympus radial ultrasound                            scope was introduced through the mouth, and                            advanced to the antrum of the stomach. The [id number]-[id number]                            ([id number]) Olympus endoscope was introduced through  the mouth, and advanced to the second part of                            duodenum. The upper EUS was accomplished without                            difficulty. The patient tolerated the procedure                            well. Scope In: Scope Out: Findings:      ENDOSCOPIC FINDING: :      1. Friable 1.5cm mucosal nodule in the proximal esophagus, located 18cm       from the incisors. Following EUS evaluation I sampled this nodule       extensively with forceps      2. UGI tract was otherwise normal appearing.      ENDOSONOGRAPHIC FINDING: :      1. The soft, friable nodule described above correlated with a 1.3cm       hypoechoic, heterogeneous nodule involving the mucosa, deep mucosa       layers and appeared contiguous with amorphous soft prevertebral soft       tissue which measured about 3-4cm across. There was a single small (55mm)       reactive appearing lymphnode adjacent to the prevertebral soft tissue.      2. The esophageal wall was otherwise  normal.      3. Limited views of the stomach, pancreas, liver, extrahepatic bile duct       were all normal. Impression:               - Friable 1.3cm mucosal nodule located in the very                            proximal esophagus, this appeared contiguous with                            amorphous prevertebral soft tissue described on                            recent chest/neck CT scan. I sampled the mucosal                            nodule with forceps.                           - Overall I favor this to be an inflammatory lesion                            over a neoplastic process however that may be                            difficult to know with certainty. Await final path                            results. She may require evaluation by CT surgery  or ENT, perhaps ID may be able to help as well. I                            will communicate this and the final biopsy results                            with Dr. Abbey Chatters. Moderate Sedation:      Not Applicable - Patient had care per Anesthesia. Recommendation:           - Discharge patient to home. Procedure Code(s):        --- Professional ---                           [phone], Esophagogastroduodenoscopy, flexible,                            transoral; with endoscopic ultrasound examination                            limited to the esophagus, stomach or duodenum, and                            adjacent structures                           [id number], Esophagogastroduodenoscopy, flexible,                            transoral; with biopsy, single or multiple Diagnosis Code(s):        --- Professional ---                           K22.8, Other specified diseases of esophagus CPT copyright 2019 [employer]. All rights reserved. The codes documented in this report are preliminary and upon coder review may  be revised to meet current compliance requirements. Milus Banister, MD 08/22/2021 8:29:21  AM This report has been signed electronically. Number of Addenda: 0

## 2021-08-22 NOTE — Anesthesia Postprocedure Evaluation (Signed)
Anesthesia Post Note  Patient: Melanie Thomas  Procedure(s) Performed: UPPER ENDOSCOPIC ULTRASOUND (EUS) RADIAL ESOPHAGOGASTRODUODENOSCOPY (EGD) BIOPSY     Patient location during evaluation: Endoscopy Anesthesia Type: MAC Level of consciousness: awake Pain management: pain level controlled Vital Signs Assessment: post-procedure vital signs reviewed and stable Respiratory status: spontaneous breathing, nonlabored ventilation, respiratory function stable and patient connected to nasal cannula oxygen Cardiovascular status: stable and blood pressure returned to baseline Postop Assessment: no apparent nausea or vomiting Anesthetic complications: no   No notable events documented.  Last Vitals:  Vitals:   08/22/21 0830 08/22/21 0840  BP: 100/64 113/69  Pulse: 86 84  Resp: 19 16  Temp:    SpO2: 100% 100%    Last Pain:  Vitals:   08/22/21 0840  TempSrc:   PainSc: 0-No pain                 Tamaka Sawin P Danniela Mcbrearty

## 2021-08-22 NOTE — Anesthesia Procedure Notes (Signed)
Procedure Name: MAC Date/Time: 08/22/2021 7:40 AM Performed by: Eben Burow, CRNA Pre-anesthesia Checklist: Patient identified, Emergency Drugs available, Suction available, Patient being monitored and Timeout performed Oxygen Delivery Method: Simple face mask Placement Confirmation: positive ETCO2

## 2021-08-23 ENCOUNTER — Encounter (HOSPITAL_COMMUNITY): Payer: Self-pay | Admitting: Gastroenterology

## 2021-08-25 LAB — SURGICAL PATHOLOGY

## 2021-08-28 ENCOUNTER — Telehealth: Payer: Self-pay

## 2021-08-28 NOTE — Telephone Encounter (Signed)
Pt called stating that she recently had an ultrasound of her esophagus last Tuesday at [facility] and pt is wanting to know what her next steps are.

## 2021-09-01 ENCOUNTER — Telehealth: Payer: Self-pay | Admitting: Internal Medicine

## 2021-09-01 NOTE — Telephone Encounter (Signed)
Patient called checking if anything had been decided on the next step in her care plan

## 2021-09-01 NOTE — Telephone Encounter (Signed)
See other note

## 2021-09-01 NOTE — Telephone Encounter (Signed)
Patient called asking if anything had been decided on her care

## 2021-09-01 NOTE — Telephone Encounter (Signed)
Good Morning Dr Abbey Chatters, This pt is calling to find out what is the next step in her care plan. Please advise

## 2021-09-05 ENCOUNTER — Telehealth: Payer: Self-pay | Admitting: Internal Medicine

## 2021-09-05 DIAGNOSIS — K2289 Other specified disease of esophagus: Secondary | ICD-10-CM

## 2021-09-05 NOTE — Telephone Encounter (Signed)
Called and spoke with patient.  She has completed EUS.  Discussed case in depth with Dr. Ardis Hughs as well.  Can we refer her to cardiothoracic surgery in [facility]?  Diagnosis esophageal mass.  Thank you

## 2021-09-05 NOTE — Addendum Note (Signed)
Addended by: Cheron Every on: 09/05/2021 04:11 PM   Modules accepted: Orders

## 2021-09-05 NOTE — Telephone Encounter (Signed)
Referral sent 

## 2021-09-06 ENCOUNTER — Telehealth: Payer: Self-pay | Admitting: Internal Medicine

## 2021-09-06 NOTE — Telephone Encounter (Signed)
Pt returned the call and was re-advised of Dr. Ave Filter prior conversation with her and as far as medications continue as directed until she see's the other Dr.

## 2021-09-06 NOTE — Telephone Encounter (Signed)
Returned the pt's call and was sent to vm. LMOVM

## 2021-09-06 NOTE — Telephone Encounter (Signed)
Please call patient, she has questions about her medications
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
## 2021-09-15 ENCOUNTER — Institutional Professional Consult (permissible substitution): Payer: Medicaid Other | Admitting: Thoracic Surgery (Cardiothoracic Vascular Surgery)

## 2021-09-15 ENCOUNTER — Telehealth: Payer: Self-pay | Admitting: Internal Medicine

## 2021-09-15 ENCOUNTER — Other Ambulatory Visit: Payer: Self-pay

## 2021-09-15 VITALS — BP 132/91 | HR 85 | Resp 20 | Ht 62.0 in | Wt 205.0 lb

## 2021-09-15 DIAGNOSIS — K2289 Other specified disease of esophagus: Secondary | ICD-10-CM

## 2021-09-15 NOTE — Telephone Encounter (Signed)
Patient is being referred to cardiothoracic surgery at [facility].  Can we obtain disc of her CT \\chest  and send along with all EGD reports and EUS reports to Dr Erie Noe at [facility] thoracic

## 2021-09-18 NOTE — Telephone Encounter (Signed)
Noted  

## 2021-09-18 NOTE — Addendum Note (Signed)
Addended by: Hassan Rowan on: 09/18/2021 09:13 AM   Modules accepted: Orders

## 2021-09-18 NOTE — Telephone Encounter (Signed)
[facility] [phone]), spoke to receptionist. Fax# [phone].  Referral faxed to [facility]. Manuela Schwartz, can you please have CT chest that was done on 07/13/21 put on disc and sent to Dr. Erie Noe?

## 2021-09-18 NOTE — Telephone Encounter (Signed)
I call [facility] radiology and spoke with Manuela Schwartz. She is aware of request of CT Chest from 07/13/21 to be put on disc and sent to Dr Erie Noe at [facility] cardiothoracic Surgery in [place].

## 2021-12-22 ENCOUNTER — Other Ambulatory Visit (HOSPITAL_COMMUNITY): Payer: Self-pay | Admitting: Orthopedic Surgery

## 2021-12-22 ENCOUNTER — Ambulatory Visit (HOSPITAL_COMMUNITY)
Admission: RE | Admit: 2021-12-22 | Discharge: 2021-12-22 | Disposition: A | Discharge status: Home / Self Care | Payer: Medicaid Other | Source: Ambulatory Visit | Attending: Orthopedic Surgery | Admitting: Orthopedic Surgery

## 2021-12-22 DIAGNOSIS — M47812 Spondylosis without myelopathy or radiculopathy, cervical region: Secondary | ICD-10-CM

## 2022-01-21 DIAGNOSIS — L0211 Cutaneous abscess of neck: Secondary | ICD-10-CM | POA: Insufficient documentation

## 2022-02-26 DIAGNOSIS — A1801 Tuberculosis of spine: Secondary | ICD-10-CM | POA: Insufficient documentation

## 2022-02-26 DIAGNOSIS — R112 Nausea with vomiting, unspecified: Secondary | ICD-10-CM | POA: Insufficient documentation

## 2022-02-26 DIAGNOSIS — Q396 Congenital diverticulum of esophagus: Secondary | ICD-10-CM | POA: Insufficient documentation

## 2022-03-05 ENCOUNTER — Other Ambulatory Visit: Payer: Self-pay | Admitting: Internal Medicine

## 2022-03-05 NOTE — Telephone Encounter (Signed)
Pt needs RF on Omeprazole 40 mg capsule. Pt last visit on 03-02-2021

## 2022-05-11 ENCOUNTER — Other Ambulatory Visit: Payer: Self-pay

## 2022-05-11 ENCOUNTER — Emergency Department (HOSPITAL_COMMUNITY)
Admission: EM | Admit: 2022-05-11 | Discharge: 2022-05-12 | Disposition: A | Discharge status: Home / Self Care | Payer: Medicaid Other | Attending: Emergency Medicine | Admitting: Emergency Medicine

## 2022-05-11 ENCOUNTER — Encounter (HOSPITAL_COMMUNITY): Payer: Self-pay

## 2022-05-11 DIAGNOSIS — R519 Headache, unspecified: Secondary | ICD-10-CM | POA: Diagnosis present

## 2022-05-11 DIAGNOSIS — Z91040 Latex allergy status: Secondary | ICD-10-CM | POA: Diagnosis not present

## 2022-05-11 DIAGNOSIS — G43809 Other migraine, not intractable, without status migrainosus: Secondary | ICD-10-CM

## 2022-05-11 LAB — URINALYSIS, ROUTINE W REFLEX MICROSCOPIC
Bilirubin Urine: NEGATIVE
Glucose, UA: NEGATIVE mg/dL
Hgb urine dipstick: NEGATIVE
Ketones, ur: NEGATIVE mg/dL
Leukocytes,Ua: NEGATIVE
Nitrite: NEGATIVE
Protein, ur: NEGATIVE mg/dL
Specific Gravity, Urine: 1.009 (ref 1.005–1.030)
pH: 6 (ref 5.0–8.0)

## 2022-05-11 LAB — PREGNANCY, URINE: Preg Test, Ur: NEGATIVE

## 2022-05-11 MED ORDER — PROCHLORPERAZINE EDISYLATE 10 MG/2ML IJ SOLN
10.0000 mg | Freq: Once | INTRAMUSCULAR | Status: AC
  Administered 2022-05-11: 10 mg via INTRAVENOUS
  Filled 2022-05-11: qty 2

## 2022-05-11 MED ORDER — KETOROLAC TROMETHAMINE 30 MG/ML IJ SOLN
30.0000 mg | Freq: Once | INTRAMUSCULAR | Status: AC
  Administered 2022-05-11: 30 mg via INTRAVENOUS
  Filled 2022-05-11: qty 1

## 2022-05-11 MED ORDER — SODIUM CHLORIDE 0.9 % IV BOLUS
1000.0000 mL | Freq: Once | INTRAVENOUS | Status: AC
  Administered 2022-05-11: 1000 mL via INTRAVENOUS

## 2022-05-11 NOTE — ED Triage Notes (Signed)
Migraine x1week. Pt reports pain is worse than normal. Pt has had nose bleeds. Recent d/c from [facility] with kidney infection. Reports stabbing pain on left temporal. Took home pain medication with no relief.
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
## 2022-05-11 NOTE — ED Notes (Signed)
Tech placing pt on non rebreather per MD

## 2023-04-12 IMAGING — CT CT CHEST W/ CM
2 of 3 series · 15 of 36 positions shown, 18 images · IV contrast (Omnipaque or Isovue)
Comparison: Cervical spine CT/neck CTA 11/06/2020. Cervical spine
radiographs 04/07/2019 cervical spine CT 11/16/2014.

CLINICAL DATA: Esophageal mass.

EXAM:
CT CHEST WITH CONTRAST
TECHNIQUE: Multidetector CT imaging of the chest was performed during
intravenous contrast administration.
CONTRAST:  75mL OMNIPAQUE IOHEXOL 300 MG/ML  SOLN

[Series 2: routine chest with · axial · 0.61mm/px · z∈[-278,-16]mm · 12 of 155 slices shown, 15 images]
[im 12/155  mediastinal]
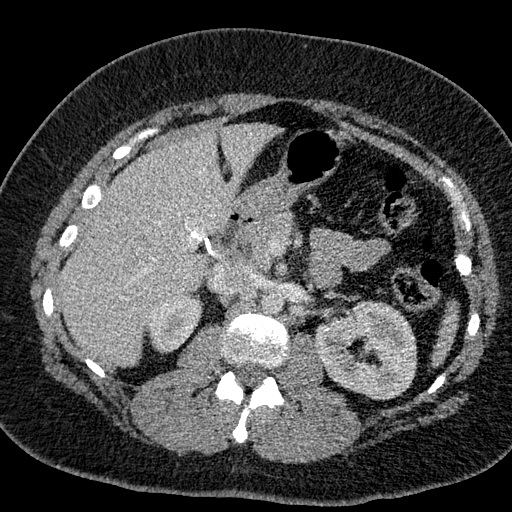
[im 12/155  lung]
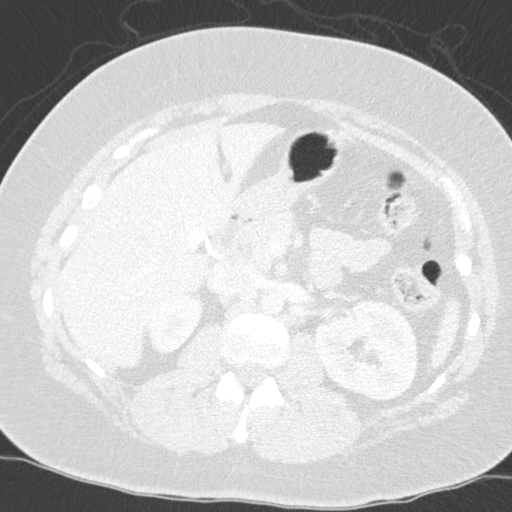
[im 23/155  lung]
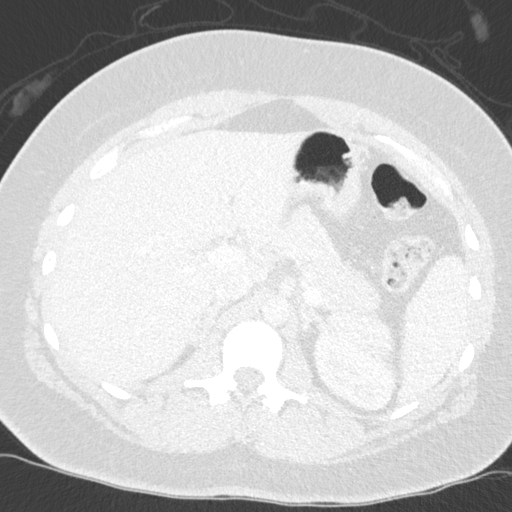
[im 35/155  lung]
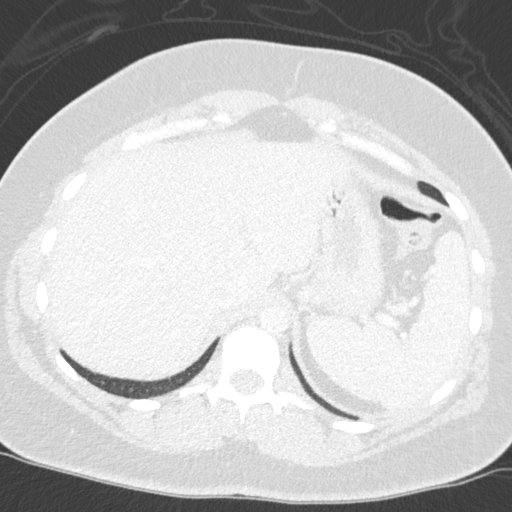
[im 46/155  lung]
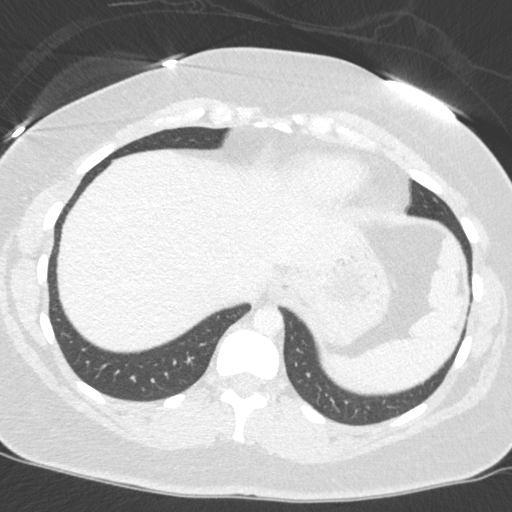
[im 58/155  mediastinal]
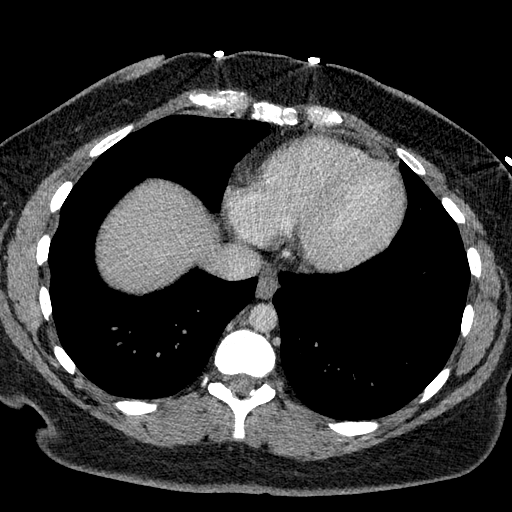
[im 58/155  lung]
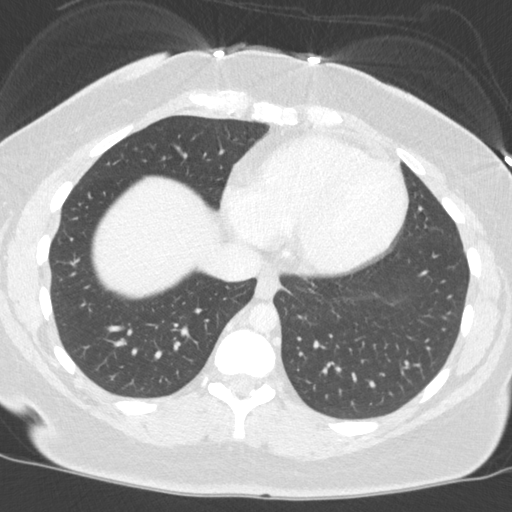
[im 69/155  lung]
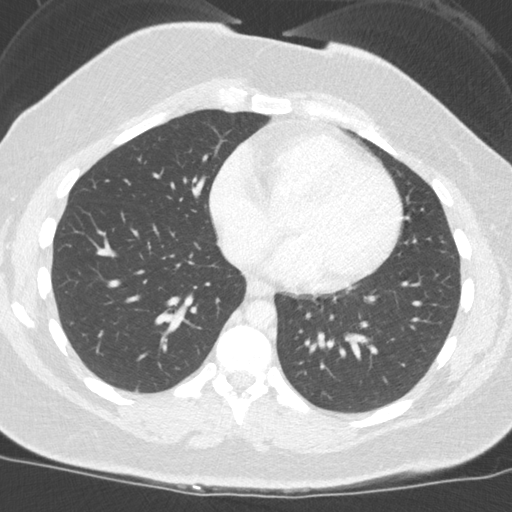
[im 86/155  lung]
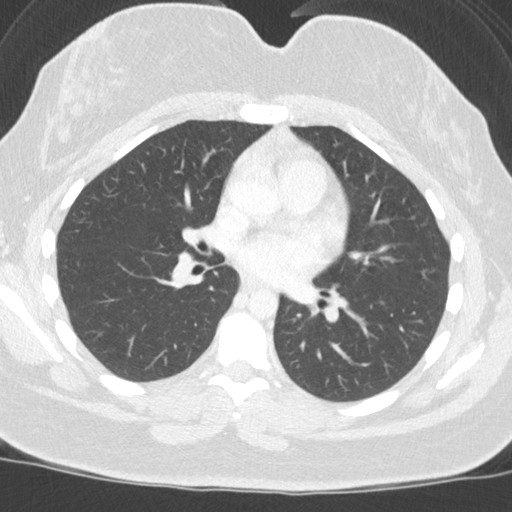
[im 97/155  lung]
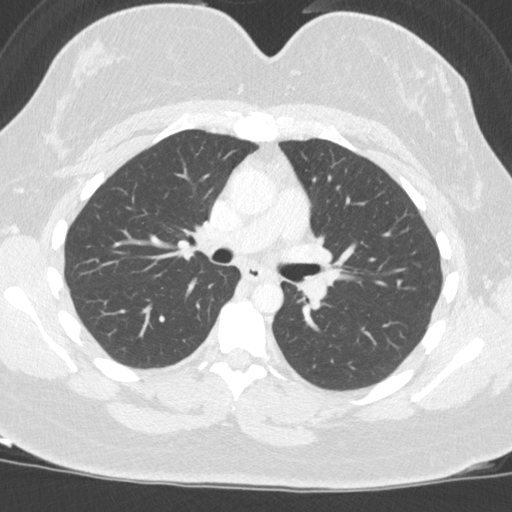
[im 109/155  mediastinal]
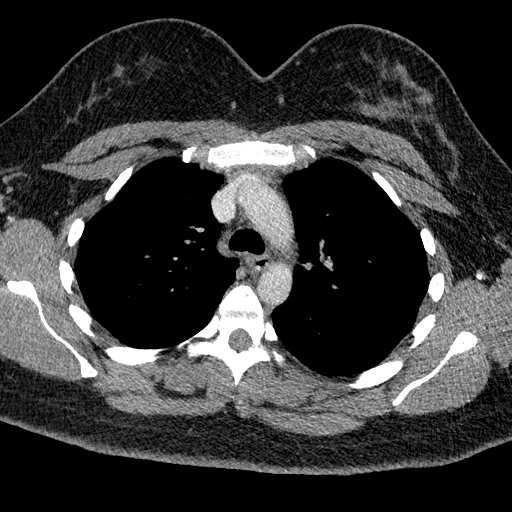
[im 109/155  lung]
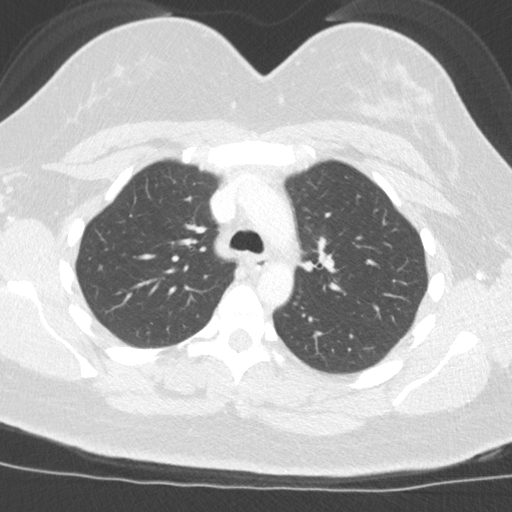
[im 120/155  lung]
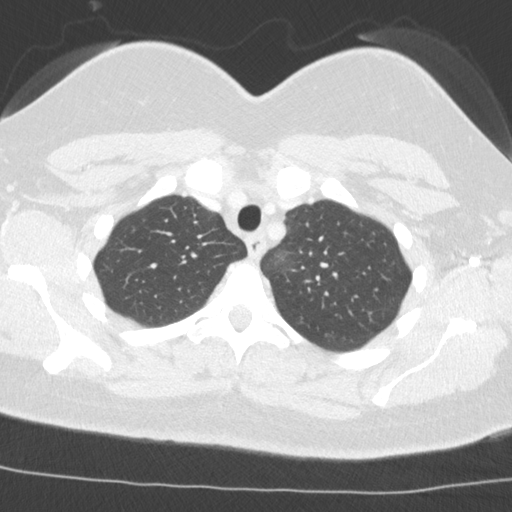
[im 132/155  lung]
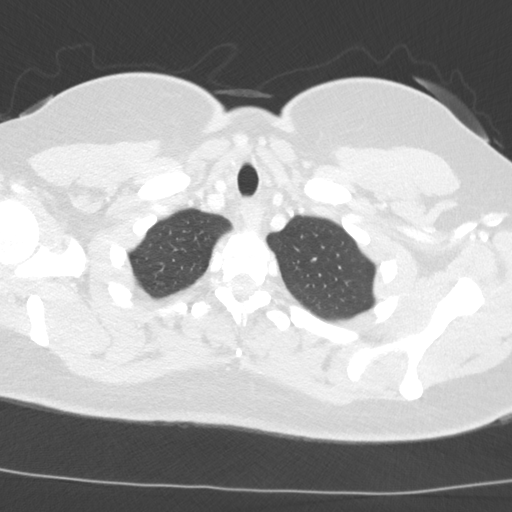
[im 143/155  lung]
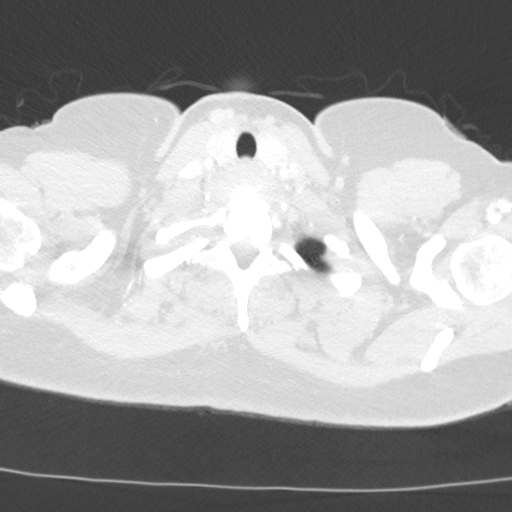

[Series 5: coronal · coronal · 0.63mm/px · 3 of 134 slices shown]
[im 27/134  lung]
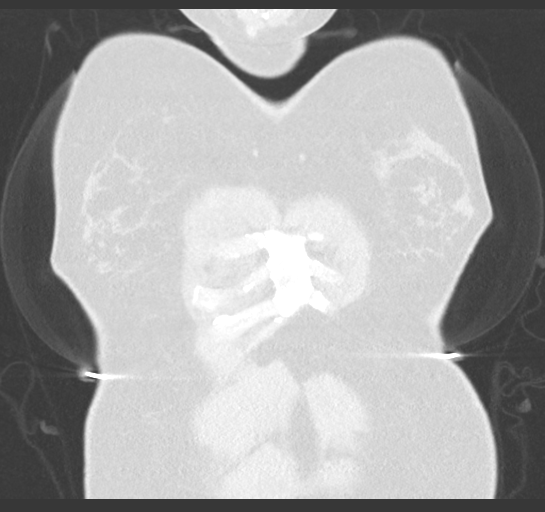
[im 54/134  lung]
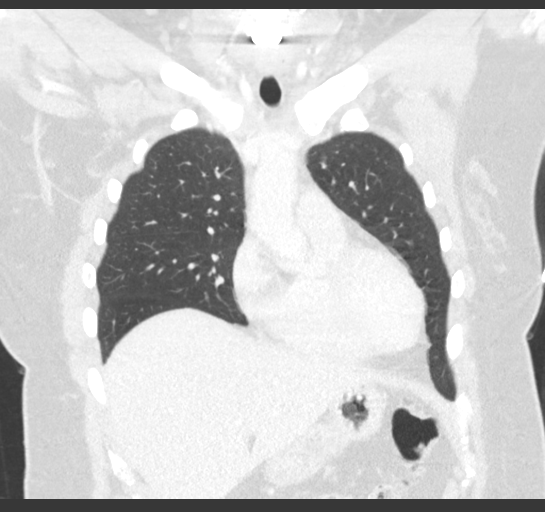
[im 80/134  lung]
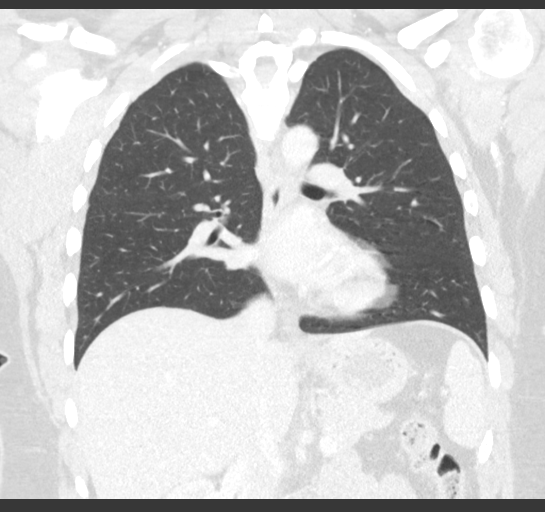

[15 of 36 positions shown; findings below may reference images not displayed]

FINDINGS: Cardiovascular: Normal caliber of the thoracic aorta. Normal heart
size. No pericardial effusion.

Mediastinum/Nodes: No enlarged axillary, mediastinal, or hilar lymph
nodes. Abnormal, somewhat amorphous prevertebral soft tissue in the
lower cervical spine measuring approximately 4 cm in transverse
width and inseparable from the proximal esophagus. This prevertebral
soft tissue is new from the 4249 spine CT which predates the
patient's ACDF, however 7373 cervical spine radiographs demonstrate
prevertebral soft tissue thickening at and below the patient's
fusion. Assessment for esophageal wall thickening is limited by lack
of distension. Unremarkable thyroid.

Lungs/Pleura: No pleural effusion or pneumothorax. No suspicious
lung nodule or consolidation. Patent central airways.

Upper Abdomen: Cholecystectomy.

Musculoskeletal: C5-C7 ACDF.  No suspicious osseous lesion.
IMPRESSION: Amorphous lower cervical prevertebral soft tissue which is
inseparable from the proximal esophagus, not present in 4249 (prior
to the patient's ACDF) though with evidence of chronic prevertebral
soft tissue thickening on interval studies. This is nonspecific but
could be inflammatory or granulomatous. Neoplasm is not strictly
excluded but is considered less likely. Correlate with biopsy
results and planned endoscopic ultrasound.

## 2024-07-01 NOTE — Progress Notes (Addendum)
 Subjective:  Patient ID: Melanie Thomas, female    DOB: [birth year], 38 y.o.   MRN: [id number]  Patient Care Team: Deitra Morton Sebastian Nena, NP as PCP - General (Nurse Practitioner) Cindie Carlin POUR, DO as Consulting Physician (Gastroenterology)   Chief Complaint:  New Patient (Initial Visit)   HPI: Melanie Thomas is a 38 y.o. female presenting on 07/06/2024 for New Patient (Initial Visit)   Discussed the use of AI scribe software for clinical note transcription with the patient, who gave verbal consent to proceed.  History of Present Illness Melanie Thomas is a 38 year old female who presents to establish care.  She has a history of hypothyroidism and has been on Synthroid  for the past couple of years after the generic version was ineffective.  She has allergic rhinitis and takes Zyrtec  daily. She also requires an EpiPen  for unspecified allergies.  She has GERD and was switched to Protonix  following her last surgery, which she finds more effective than her previous medication, omeprazole .  She has a history of POTS, which causes balance and coordination issues, particularly when standing up or sitting down. She manages these symptoms by resting and occasionally uses Phenergan  for dizziness. She experienced a flare-up last Thursday, which she managed by resting.  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 She also reports difficulty sleeping since the surgery, describing it as 'hard to get comfortable' and her 'mind just goes at night.' She has not been taking any medication for insomnia.  She reports a recent eye injury from last Thursday, which was treated at urgent care with antibiotic drops after something fell into her eye while opening a window. She can now open her eye and feels it is much improved.  She has a history of multiple surgeries on her throat and arm, as well  as gallbladder removal, which has resulted in frequent bowel movements.  She does not smoke and occasionally drinks alcohol, such as a glass of wine on special occasions.       07/06/2024    9:10 AM  PHQ9 SCORE ONLY  PHQ-9 Total Score 8       07/06/2024    9:10 AM  GAD 7 : Generalized Anxiety Score  Nervous, Anxious, on Edge 1  Control/stop worrying 1  Worry too much - different things 1  Trouble relaxing 2  Restless 0  Easily annoyed or irritable 1  Afraid - awful might happen 1  Total GAD 7 Score 7  Anxiety Difficulty Somewhat difficult      Relevant past medical, surgical, family, and social history reviewed and updated as indicated.  Allergies and medications reviewed and updated. Data reviewed: Chart in Epic.   Past Medical History:  Diagnosis Date   Allergy    Anxiety    Chiari malformation    GERD (gastroesophageal reflux disease)    History of angioedema    PONV (postoperative nausea and vomiting)    POTS (postural orthostatic tachycardia syndrome)    Thyroid  disease     Past Surgical History:  Procedure Laterality Date   ANAL FISSURE REPAIR N/A 11/21/2012   Procedure: ANAL FISSURECTOMY;  Surgeon: Oneil DELENA Budge, MD;  Location: [facility];  Service: General;  Laterality: N/A;   BALLOON DILATION N/A 04/07/2021   Procedure: MERRILL HODGKIN;  Surgeon: Cindie Carlin POUR, DO;  Location: [facility];  Service: Endoscopy;  Laterality: N/A;   BIOPSY  04/07/2021   Procedure: BIOPSY;  Surgeon: Cindie Carlin POUR, DO;  Location: [facility];  Service: Endoscopy;;   BIOPSY  06/27/2021   Procedure: BIOPSY;  Surgeon: Cindie Carlin POUR, DO;  Location: [facility];  Service: Endoscopy;;   BIOPSY  08/22/2021   Procedure: BIOPSY;  Surgeon: Teressa Toribio SQUIBB, MD;  Location: [facility];  Service: Endoscopy;;   BRAIN SURGERY     CESAREAN SECTION  2006   [facility]   CHOLECYSTECTOMY N/A 08/06/2016   Procedure: LAPAROSCOPIC CHOLECYSTECTOMY;  Surgeon: Oneil Budge, MD;   Location: [facility];  Service: General;  Laterality: N/A;   CRANIECTOMY SUBOCCIPITAL W/ CERVICAL LAMINECTOMY / CHIARI Bilateral 02/29/2016   [facility]    DENTAL SURGERY  2004, 2009   ESOPHAGOGASTRODUODENOSCOPY N/A 08/22/2021   Procedure: ESOPHAGOGASTRODUODENOSCOPY (EGD);  Surgeon: Teressa Toribio SQUIBB, MD;  Location: [facility];  Service: Endoscopy;  Laterality: N/A;   ESOPHAGOGASTRODUODENOSCOPY (EGD) WITH PROPOFOL  N/A 04/07/2021   Procedure: ESOPHAGOGASTRODUODENOSCOPY (EGD) WITH PROPOFOL ;  Surgeon: Cindie Carlin POUR, DO;  [facility];  Service: Endoscopy;  Laterality: N/A;  8:30am   ESOPHAGOGASTRODUODENOSCOPY (EGD) WITH PROPOFOL  N/A 06/27/2021   Procedure: ESOPHAGOGASTRODUODENOSCOPY (EGD) WITH PROPOFOL ;  Surgeon: Cindie Carlin POUR, DO;  [facility];  Service: Endoscopy;  Laterality: N/A;  9:30am   EUS N/A 08/22/2021   Procedure: UPPER ENDOSCOPIC ULTRASOUND (EUS) RADIAL;  Surgeon: Teressa Toribio SQUIBB, MD;  Location: [facility];  Service: Endoscopy;  Laterality: N/A;   SPINE SURGERY      Social History   Socioeconomic History   Marital status: Legally Separated    Spouse name: Not on file   Number of children: Not on file   Years of education: Not on file   Highest education level: GED or equivalent  Occupational History   Not on file  Tobacco Use   Smoking status: Former    Current packs/day: 0.00    Average packs/day: 0.5 packs/day for 2.0 years (1.0 ttl pk-yrs)    Types: Cigarettes    Start date: 08/02/2005    Quit date: 08/03/2007    Years since quitting: 16.9   Smokeless tobacco: Never  Vaping Use   Vaping status: Never Used  Substance and Sexual Activity   Alcohol use: Yes    Alcohol/week: 1.0 standard drink of alcohol    Types: 1 Glasses of wine per week    Comment: rare use   Drug use: Never   Sexual activity: Yes    Birth control/protection: None  Other Topics Concern   Not on file  Social History Narrative   Not on file   Social  Drivers of Health   Financial Resource Strain: Medium Risk (07/06/2024)   Overall Financial Resource Strain (CARDIA)    Difficulty of Paying Living Expenses: Somewhat hard  Food Insecurity: Food Insecurity Present (07/06/2024)   Hunger Vital Sign    Worried About Running Out of Food in the Last Year: Sometimes true    Ran Out of Food in the Last Year: Sometimes true  Transportation Needs: No Transportation Needs (07/06/2024)   PRAPARE - Administrator, Civil Service (Medical): No    Lack of Transportation (Non-Medical): No  Physical Activity: Insufficiently Active (07/06/2024)   Exercise Vital Sign    Days of Exercise per Week: 3 days    Minutes of Exercise per Session: 30 min  Stress: Stress Concern Present (07/06/2024)   [employer] of Occupational Health - Occupational Stress Questionnaire    Feeling  of Stress: Very much  Social Connections: Moderately Integrated (07/06/2024)   Social Connection and Isolation Panel    Frequency of Communication with Friends and Family: More than three times a week    Frequency of Social Gatherings with Friends and Family: Twice a week    Attends Religious Services: More than 4 times per year    Active Member of [employer] or Organizations: Yes    Attends Engineer, Structural: More than 4 times per year    Marital Status: Divorced  Intimate Partner Violence: Not At Risk (07/06/2024)   Humiliation, Afraid, Rape, and Kick questionnaire    Fear of Current or Ex-Partner: No    Emotionally Abused: No    Physically Abused: No    Sexually Abused: No    Outpatient Encounter Medications as of 07/06/2024  Medication Sig   cetirizine  (ZYRTEC ) 10 MG tablet Take 1 tablet (10 mg total) by mouth daily.   cyclobenzaprine (FLEXERIL) 5 MG tablet Take 5 mg by mouth 3 (three) times daily as needed for muscle spasms.   hydrOXYzine  (ATARAX ) 25 MG tablet Take 1 tablet (25 mg total) by mouth at bedtime.   naproxen sodium (ANAPROX) 220 MG tablet Take  440 mg by mouth every 12 (twelve) hours as needed (pain).   promethazine  (PHENERGAN ) 25 MG tablet Take 25 mg by mouth every 6 (six) hours as needed for nausea/vomiting.   [DISCONTINUED] EPINEPHrine  0.3 mg/0.3 mL IJ SOAJ injection Inject 0.3 mg into the muscle as needed for anaphylaxis.   [DISCONTINUED] hydrOXYzine  (ATARAX ) 25 MG tablet Take 1 tablet (25 mg total) by mouth 3 (three) times daily as needed.   [DISCONTINUED] pantoprazole  (PROTONIX ) 40 MG tablet Take 40 mg by mouth daily.   EPINEPHrine  0.3 mg/0.3 mL IJ SOAJ injection Inject 0.3 mg into the muscle as needed for anaphylaxis.   levothyroxine  (SYNTHROID , LEVOTHROID) 75 MCG tablet Take 75 mcg by mouth daily before breakfast.   pantoprazole  (PROTONIX ) 40 MG tablet Take 1 tablet (40 mg total) by mouth daily.   [DISCONTINUED] fexofenadine (ALLEGRA) 180 MG tablet Take 180 mg by mouth every evening.   [DISCONTINUED] omeprazole  (PRILOSEC) 40 MG capsule TAKE ONE CAPSULE BY MOUTH TWICE DAILY   No facility-administered encounter medications on file as of 07/06/2024.    Allergies  Allergen Reactions   Aspirin Anaphylaxis, Rash and Other (See Comments)    REACTION: Blisters. Pt reports angioedema.   Aspirin-Acetaminophen -Caffeine Anaphylaxis   Red Dye #40 (Allura Red) Hives    Welts/blisters   Welts/blisters   Verapamil Anaphylaxis   Cyanoacrylate     Skin glue caused swelling under skin and blisters   Doxycycline Hives   Excedrin Tension Headache [Acetaminophen -Caffeine] Swelling    Pt reports angioedema.   Ibuprofen Swelling    Pt reports angioedema. Can take aleve occassionally   Other     Clorox, certain shampoo, soaps, trees, mold, red dye   Gabapentin Swelling and Hives    Per patient swelling around throat   Latex Swelling and Rash    Pt reports angioedema.   Sumatriptan Dermatitis, Nausea And Vomiting, Nausea Only and Other (See Comments)    Pt reports N/V with previous doses of rx   Unknown reaction    Pt reports N/V  with previous doses of rx    Review of Systems  Constitutional:  Negative for chills and fever.  HENT:  Negative for congestion and sore throat.   Respiratory:  Negative for cough, shortness of breath and wheezing.   Cardiovascular:  Negative for chest pain and leg swelling.  Gastrointestinal:  Negative for blood in stool, constipation, diarrhea, melena, nausea and vomiting.  Musculoskeletal:  Negative for falls.  Skin:  Negative for itching and rash.  Neurological:  Negative for dizziness and headaches.  Psychiatric/Behavioral:  The patient does not have insomnia.          Objective:  BP 107/68   Pulse 74   Temp 98.7 F (37.1 C)   Ht 5' 2.5 (1.588 m)   Wt 213 lb 3.2 oz (96.7 kg)   LMP 06/30/2024 (Exact Date)   SpO2 96%   BMI 38.37 kg/m    Wt Readings from Last 3 Encounters:  07/06/24 213 lb 3.2 oz (96.7 kg)  05/11/22 190 lb (86.2 kg)  09/15/21 205 lb (93 kg)    Physical Exam Vitals and nursing note reviewed.  Constitutional:      Appearance: She is obese.  HENT:     Head: Normocephalic and atraumatic.     Right Ear: Tympanic membrane, ear canal and external ear normal. There is no impacted cerumen.     Left Ear: Tympanic membrane, ear canal and external ear normal. There is no impacted cerumen.     Nose: Nose normal.     Mouth/Throat:     Mouth: Mucous membranes are moist.  Eyes:     General: No scleral icterus.    Extraocular Movements: Extraocular movements intact.     Conjunctiva/sclera: Conjunctivae normal.     Pupils: Pupils are equal, round, and reactive to light.  Cardiovascular:     Heart sounds: Normal heart sounds.  Pulmonary:     Effort: Pulmonary effort is normal.  Abdominal:     General: Bowel sounds are normal.     Palpations: Abdomen is soft.  Musculoskeletal:        General: Normal range of motion.  Skin:    General: Skin is warm.  Neurological:     Mental Status: She is alert and oriented to person, place, and time.  Psychiatric:         Mood and Affect: Mood normal.        Behavior: Behavior normal.        Thought Content: Thought content normal.        Judgment: Judgment normal.    Physical Exam      Results for orders placed or performed during the hospital encounter of 05/11/22  Urinalysis, Routine w reflex microscopic Urine, Clean Catch   Collection Time: 05/11/22 10:17 PM  Result Value Ref Range   Color, Urine YELLOW YELLOW   APPearance CLEAR CLEAR   Specific Gravity, Urine 1.009 1.005 - 1.030   pH 6.0 5.0 - 8.0   Glucose, UA NEGATIVE NEGATIVE mg/dL   Hgb urine dipstick NEGATIVE NEGATIVE   Bilirubin Urine NEGATIVE NEGATIVE   Ketones, ur NEGATIVE NEGATIVE mg/dL   Protein, ur NEGATIVE NEGATIVE mg/dL   Nitrite NEGATIVE NEGATIVE   Leukocytes,Ua NEGATIVE NEGATIVE  Pregnancy, urine   Collection Time: 05/11/22 10:17 PM  Result Value Ref Range   Preg Test, Ur NEGATIVE NEGATIVE       Pertinent labs & imaging results that were available during my care of the patient were reviewed by me and considered in my medical decision making.  Assessment & Plan:  Akia was seen today for new patient (initial visit).  Diagnoses and all orders for this visit:  Encounter for general adult medical examination with abnormal findings -     CBC with Differential/Platelet -  CMP14+EGFR -     HepB+HepC+HIV Panel -     Thyroid  Panel With TSH -     Lipid panel -     pantoprazole  (PROTONIX ) 40 MG tablet; Take 1 tablet (40 mg total) by mouth daily. -     Discontinue: hydrOXYzine  (ATARAX ) 25 MG tablet; Take 1 tablet (25 mg total) by mouth 3 at bedtime as needed. -     Ambulatory referral to Psychology -     cetirizine  (ZYRTEC ) 10 MG tablet; Take 1 tablet (10 mg total) by mouth daily. -     EPINEPHrine  0.3 mg/0.3 mL IJ SOAJ injection; Inject 0.3 mg into the muscle as needed for anaphylaxis.  Hypothyroidism, unspecified type -     Thyroid  Panel With TSH  Mild episode of recurrent major depressive disorder -      Ambulatory referral to Psychology -     hydrOXYzine  (ATARAX ) 25 MG tablet; Take 1 tablet (25 mg total) by mouth at bedtime.  Other insomnia -     Ambulatory referral to Psychology -     hydrOXYzine  (ATARAX ) 25 MG tablet; Take 1 tablet (25 mg total) by mouth at bedtime.  GAD (generalized anxiety disorder) -     Discontinue: hydrOXYzine  (ATARAX ) 25 MG tablet; Take 1 tablet (25 mg total) by mouth 3 (three) times daily as needed. -     Ambulatory referral to Psychology -     hydrOXYzine  (ATARAX ) 25 MG tablet; Take 1 tablet (25 mg total) by mouth at bedtime.  Allergic rhinitis, unspecified seasonality, unspecified trigger -     cetirizine  (ZYRTEC ) 10 MG tablet; Take 1 tablet (10 mg total) by mouth daily.  Gastroesophageal reflux disease without esophagitis -     CBC with Differential/Platelet -     pantoprazole  (PROTONIX ) 40 MG tablet; Take 1 tablet (40 mg total) by mouth daily.  Pott's disease  Class 2 obesity due to excess calories without serious comorbidity with body mass index (BMI) of 38.0 to 38.9 in adult -     Thyroid  Panel With TSH -     Lipid panel -     Bayer DCA Hb A1c Waived  Family history of CHF (congestive heart failure)  Family history of breast cancer  Grief -     Ambulatory referral to Psychology     Assessment and Plan Anelle is a 38 yrs old Caucasian female seen today to establish care, no acute distress  Assessment & Plan Adult Wellness Visit Routine wellness visit with focus on screenings due to family history of breast cancer. - Schedule Pap smear with GYN. - Perform monthly self-breast exams. - Consider genetic testing for breast cancer risk.  Hypothyroidism Managed with Symbiora. Recent switch to brand name for better response. Monitoring thyroid  levels for dosage adjustment. - Ordered thyroid  function tests. - Held thyroid  medication until lab results are reviewed. - Will adjust thyroid  medication based on lab results.  Allergic  rhinitis Managed with Zyrtec . - Continue Zyrtec  daily. - Refill provided  Gastroesophageal reflux disease (GERD) GERD managed with Protonix ,  - Continue Protonix  40 mg daily as prescribed' refill provided  Insomnia Chronic insomnia worsened by stress and grief. Prefers non-pharmacological interventions. - Prescribed hydroxyzine  for sleep as needed.  Postural orthostatic tachycardia syndrome (POTS) with balance and coordination problems POTS with balance and coordination issues managed with rest and Phenergan . - Continue current management with rest and Phenergan  as needed.  Adjustment disorder with depressed mood (grief) and generalized anxiety disorder Adjustment disorder with depressed mood  due to grief and generalized anxiety. Open to counseling, not interested in medication. - Referred to grief counseling. - Offered medication for anxiety or depression if needed in the future.  Recent corneal abrasion Treated with antibiotic drops. Follow-up needed. - Continue antibiotic prescribed by urgent care      Continue all other maintenance medications.  Follow up plan: Return in about 6 months (around 01/04/2025) for Chronic Diseases Management.   Continue healthy lifestyle choices, including diet (rich in fruits, vegetables, and lean proteins, and low in salt and simple carbohydrates) and exercise (at least 30 minutes of moderate physical activity daily).  Educational handout given for    Clinical References  BMI for Adults Body mass index (BMI) is a number found using a person's weight and height. BMI can help tell how much of a person's weight is made up of fat. BMI does not measure body fat directly. It is used instead of tests that directly measure body fat, which can be difficult and expensive. What are BMI measurements used for? BMI is useful to: Find out if your weight puts you at higher risk for medical problems. Help recommend changes, such as in diet and exercise.  This can help you reach a healthy weight. BMI screening can be done again to see if these changes are working. How is BMI calculated? Your height and weight are measured. The BMI is found from those numbers. This can be done with U.S[place] or metric measurements. Note that charts and online BMI calculators are available to help you find your BMI quickly and easily without doing these calculations. To calculate your BMI in U.[place] measurements: Measure your weight in pounds (lb). Multiply the number of pounds by 703. So, for an adult who weighs 150 lb, multiply that number by 703: 150 x 703, which equals 105,450. Measure your height in inches. Then multiply that number by itself to get a measurement called inches squared. So, for an adult who is 70 inches tall, the inches squared measurement is 70 inches x 70 inches, which equals 4,900 inches squared. Divide the total from step 2 (number of lb x 703) by the total from step 3 (inches squared): 105,450  4,900 = 21.5. This is your BMI. To calculate your BMI in metric measurements:  Measure your weight in kilograms (kg). For this example, the weight is 70 kg. Measure your height in meters (m). Then multiply that number by itself to get a measurement called meters squared. So, for an adult who is 1.75 m tall, the meters squared measurement is 1.75 m x 1.75 m, which equals 3.1 meters squared. Divide the number of kilograms (your weight) by the meters squared number. In this example: 70  3.1 = 22.6. This is your BMI. What do the results mean? BMI charts are used to see if you are underweight, normal weight, overweight, or obese. The following guidelines will be used: Underweight: BMI less than 18.5. Normal weight: BMI between 18.5 and 24.9. Overweight: BMI between 25 and 29.9. Obese: BMI of 30 or above. BMI is a tool and cannot diagnose a condition. Talk with your health care provider about what your BMI means for you. Keep these notes in  mind: Weight includes fat and muscle. Someone with a muscular build, such as an athlete, may have a BMI that is higher than 24.9. In cases like these, BMI is not a correct measure of body fat. If you have a BMI of 25 or higher, your provider may need to  do more testing to find out if excess body fat is the cause. BMI is measured the same way for males and females. Females usually have more body fat than males of the same height and weight. Where to find more information For more information about BMI, including tools to quickly find your BMI, go to: Centers for Disease Control and Prevention: tonerpromos.no American Heart Association: heart.org National Heart, Lung, and Blood Institute: buffalodrycleaner.gl This information is not intended to replace advice given to you by your health care provider. Make sure you discuss any questions you have with your health care provider. Document Revised: 04/05/2022 Document Reviewed: 03/29/2022 Elsevier Patient Education  2024 Elsevier Inc. Allergic Rhinitis, Adult  Allergic rhinitis is a reaction to allergens. Allergens are things that can cause an allergic reaction. This condition affects the lining inside the nose (mucous membrane). There are two types of allergic rhinitis: Seasonal. This type is also called hay fever. It happens only during some times of the year. Perennial. This type can happen at any time of the year. This condition cannot be spread from person to person (is not contagious). It can be mild, bad, or very bad. It can develop at any age and may be outgrown. What are the causes? Pollen from grasses, trees, and weeds. Other causes can be: Dust mites. Smoke. Mold. Car fumes. The pee (urine), spit, or dander of pets. Dander is dead skin cells from a pet. What increases the risk? You are more likely to develop this condition if: You have allergies in your family. You have problems like allergies in your family. You may have: Swelling of parts of  your eyes and eyelids. Asthma. This affects how you breathe. Long-term redness and swelling on your skin. Food allergies. What are the signs or symptoms? The main symptom of this condition is a runny or stuffy nose (nasal congestion). Other symptoms may include: Sneezing or coughing. Itching and tearing of your eyes. Mucus that drips down the back of your throat (postnasal drip). This may cause a sore throat. Trouble sleeping. Feeling tired. Headache. How is this treated? There is no cure for this condition. You should avoid things that you are allergic to. Treatment can help to relieve symptoms. This may include: Medicines that block allergy symptoms, such as anti-inflammatories or antihistamines. These may be given as a shot, nasal spray, or pill. Avoiding things you are allergic to. Medicines that give you some of what you are allergic to over time. This is called immunotherapy. It is done if other treatments do not help. You may get: Shots. Medicine under your tongue. Stronger medicines, if other treatments do not help. Follow these instructions at home: Avoiding allergens Find out what things you are allergic to and avoid them. To do this, try these things: If you get allergies any time of year: Replace carpet with wood, tile, or vinyl flooring. Carpet can trap pet dander and dust. Do not smoke. Do not allow smoking in your home. Change your heating and air conditioning filters at least once a month. If you get allergies only some times of the year: Keep windows closed when you can. Plan things to do outside when pollen counts are lowest. Check pollen counts before you plan things to do outside. When you come indoors, change your clothes and shower before you sit on furniture or bedding. If you are allergic to a pet: Keep the pet out of your bedroom. Vacuum, sweep, and dust often. General instructions Take over-the-counter and prescription  medicines only as told by your  doctor. Drink enough fluid to keep your pee pale yellow. Where to find more information American Academy of Allergy, Asthma & Immunology: aaaai.org Contact a doctor if: You have a fever. You get a cough that does not go away. You make high-pitched whistling sounds when you breathe, most often when you breathe out (wheeze). Your symptoms slow you down. Your symptoms stop you from doing your normal things each day. Get help right away if: You are short of breath. This symptom may be an emergency. Get help right away. Call 911. Do not wait to see if the symptom will go away. Do not drive yourself to the hospital. This information is not intended to replace advice given to you by your health care provider. Make sure you discuss any questions you have with your health care provider. Document Revised: 03/26/2022 Document Reviewed: 03/26/2022 Elsevier Patient Education  2024 Elsevier Inc. Obesity, Adult Obesity is the condition of having too much total body fat. Being overweight or obese means that your weight is greater than what is considered healthy for your body size. Obesity is determined by a measurement called BMI (body mass index). BMI is an estimate of body fat and is calculated from height and weight. For adults, a BMI of 30 or higher is considered obese. Obesity can lead to other health concerns and major illnesses, including: Stroke. Coronary artery disease (CAD). Type 2 diabetes. Some types of cancer, including cancers of the colon, breast, uterus, and gallbladder. High blood pressure (hypertension). High cholesterol. Gallbladder stones. Obesity can also contribute to: Osteoarthritis. Sleep apnea. Infertility problems. What are the causes? Common causes of this condition include: Eating daily meals that are high in calories, sugar, and fat. Drinking high amounts of sugar-sweetened beverages, such as soft drinks. Being born with genes that may make you more likely to become  obese. Having a medical condition that causes obesity, including: Hypothyroidism. Polycystic ovarian syndrome (PCOS). Binge-eating disorder. Cushing syndrome. Taking certain medicines, such as steroids, antidepressants, and seizure medicines. Not being physically active (sedentary lifestyle). Not getting enough sleep. What increases the risk? The following factors may make you more likely to develop this condition: Having a family history of obesity. Living in an area with limited access to: [place], recreation centers, or sidewalks. Healthy food choices, such as grocery stores and farmers' markets. What are the signs or symptoms? The main sign of this condition is having too much body fat. How is this diagnosed? This condition is diagnosed based on: Your BMI. If you are an adult with a BMI of 30 or higher, you are considered obese. Your waist circumference. This measures the distance around your waistline. Your skinfold thickness. Your health care provider may gently pinch a fold of your skin and measure it. You may have other tests to check for underlying conditions. How is this treated? Treatment for this condition often includes changing your lifestyle. Treatment may include some or all of the following: Dietary changes. This may include developing a healthy meal plan. Regular physical activity. This may include activity that causes your heart to beat faster (aerobic exercise) and strength training. Work with your health care provider to design an exercise program that works for you. Medicine to help you lose weight if you are unable to lose one pound a week after six weeks of healthy eating and more physical activity. Treating conditions that cause the obesity (underlying conditions). Surgery. Surgical options may include gastric banding and gastric bypass. Surgery  may be done if: Other treatments have not helped to improve your condition. You have a BMI of 40 or higher. You have  life-threatening health problems related to obesity. Follow these instructions at home: Eating and drinking  Follow recommendations from your health care provider about what you eat and drink. Your health care provider may advise you to: Limit fast food, sweets, and processed snack foods. Choose low-fat options, such as low-fat milk instead of whole milk. Eat five or more servings of fruits or vegetables every day. Choose healthy foods when you eat out. Keep low-fat snacks available. Limit sugary drinks, such as soda, fruit juice, sweetened iced tea, and flavored milk. Drink enough water  to keep your urine pale yellow. Do not follow a fad diet. Fad diets can be unhealthy and even dangerous. Other healthful choices include: Eat at home more often. This gives you more control over what you eat. Learn to read food labels. This will help you understand how much food is considered one serving. Learn what a healthy serving size is. Physical activity Exercise regularly, as told by your health care provider. Most adults should get up to 150 minutes of moderate-intensity exercise every week. Ask your health care provider what types of exercise are safe for you and how often you should exercise. Warm up and stretch before being active. Cool down and stretch after being active. Rest between periods of activity. Lifestyle Work with your health care provider and a dietitian to set a weight-loss goal that is healthy and reasonable for you. Limit your screen time. Find ways to reward yourself that do not involve food. Do not drink alcohol if: Your health care provider tells you not to drink. You are pregnant, may be pregnant, or are planning to become pregnant. If you drink alcohol: Limit how much you have to: 0-1 drink a day for women. 0-2 drinks a day for men. Know how much alcohol is in your drink. In the [place] one drink equals one 12 oz bottle of beer (355 mL), one 5 oz glass of wine (148  mL), or one 1 oz glass of hard liquor (44 mL). General instructions Keep a weight-loss journal to keep track of the food you eat and how much exercise you get. Take over-the-counter and prescription medicines only as told by your health care provider. Take vitamins and supplements only as told by your health care provider. Consider joining a support group. Your health care provider may be able to recommend a support group. Pay attention to your mental health as obesity can lead to depression or self esteem issues. Keep all follow-up visits. This is important. Contact a health care provider if: You are unable to meet your weight-loss goal after six weeks of dietary and lifestyle changes. You have trouble breathing. Summary Obesity is the condition of having too much total body fat. Being overweight or obese means that your weight is greater than what is considered healthy for your body size. Work with your health care provider and a dietitian to set a weight-loss goal that is healthy and reasonable for you. Exercise regularly, as told by your health care provider. Ask your health care provider what types of exercise are safe for you and how often you should exercise. This information is not intended to replace advice given to you by your health care provider. Make sure you discuss any questions you have with your health care provider. Document Revised: 02/21/2021 Document Reviewed: 02/21/2021 Elsevier Patient Education  2024 [employer].  Generalized Anxiety Disorder, Adult Generalized anxiety disorder (GAD) is a mental health condition. Unlike normal worries, anxiety related to GAD is not triggered by a specific event. These worries do not fade or get better with time. GAD interferes with relationships, work, and school. GAD symptoms can vary from mild to severe. People with severe GAD can have intense waves of anxiety with physical symptoms that are similar to panic attacks. What are the  causes? The exact cause of GAD is not known, but the following are believed to have an impact: Differences in natural brain chemicals. Genes passed down from parents to children. Differences in the way threats are perceived. Development and stress during childhood. Personality. What increases the risk? The following factors may make you more likely to develop this condition: Being female. Having a family history of anxiety disorders. Being very shy. Experiencing very stressful life events, such as the death of a loved one. Having a very stressful family environment. What are the signs or symptoms? People with GAD often worry excessively about many things in their lives, such as their health and family. Symptoms may also include: Mental and emotional symptoms: Worrying excessively about natural disasters. Fear of being late. Difficulty concentrating. Fears that others are judging your performance. Physical symptoms: Fatigue. Headaches, muscle tension, muscle twitches, trembling, or feeling shaky. Feeling like your heart is pounding or beating very fast. Feeling out of breath or like you cannot take a deep breath. Having trouble falling asleep or staying asleep, or experiencing restlessness. Sweating. Nausea, diarrhea, or irritable bowel syndrome (IBS). Behavioral symptoms: Experiencing erratic moods or irritability. Avoidance of new situations. Avoidance of people. Extreme difficulty making decisions. How is this diagnosed? This condition is diagnosed based on your symptoms and medical history. You will also have a physical exam. Your health care provider may perform tests to rule out other possible causes of your symptoms. To be diagnosed with GAD, a person must have anxiety that: Is out of his or her control. Affects several different aspects of his or her life, such as work and relationships. Causes distress that makes him or her unable to take part in normal  activities. Includes at least three symptoms of GAD, such as restlessness, fatigue, trouble concentrating, irritability, muscle tension, or sleep problems. Before your health care provider can confirm a diagnosis of GAD, these symptoms must be present more days than they are not, and they must last for 6 months or longer. How is this treated? This condition may be treated with: Medicine. Antidepressant medicine is usually prescribed for long-term daily control. Anti-anxiety medicines may be added in severe cases, especially when panic attacks occur. Talk therapy (psychotherapy). Certain types of talk therapy can be helpful in treating GAD by providing support, education, and guidance. Options include: Cognitive behavioral therapy (CBT). People learn coping skills and self-calming techniques to ease their physical symptoms. They learn to identify unrealistic thoughts and behaviors and to replace them with more appropriate thoughts and behaviors. Acceptance and commitment therapy (ACT). This treatment teaches people how to be mindful as a way to cope with unwanted thoughts and feelings. Biofeedback. This process trains you to manage your body's response (physiological response) through breathing techniques and relaxation methods. You will work with a therapist while machines are used to monitor your physical symptoms. Stress management techniques. These include yoga, meditation, and exercise. A mental health specialist can help determine which treatment is best for you. Some people see improvement with one type of therapy. However, other people require  a combination of therapies. Follow these instructions at home: Lifestyle Maintain a consistent routine and schedule. Anticipate stressful situations. Create a plan and allow extra time to work with your plan. Practice stress management or self-calming techniques that you have learned from your therapist or your health care provider. Exercise regularly  and spend time outdoors. Eat a healthy diet that includes plenty of vegetables, fruits, whole grains, low-fat dairy products, and lean protein. Do not eat a lot of foods that are high in fat, added sugar, or salt (sodium). Drink plenty of water . Avoid alcohol. Alcohol can increase anxiety. Avoid caffeine and certain over-the-counter cold medicines. These may make you feel worse. Ask your pharmacist which medicines to avoid. General instructions Take over-the-counter and prescription medicines only as told by your health care provider. Understand that you are likely to have setbacks. Accept this and be kind to yourself as you persist to take better care of yourself. Anticipate stressful situations. Create a plan and allow extra time to work with your plan. Recognize and accept your accomplishments, even if you judge them as small. Spend time with people who care about you. Keep all follow-up visits. This is important. Where to find more information [employer] of Mental Health: http://www.maynard.net/ Substance Abuse and Mental Health Services: skateoasis.com.pt Contact a health care provider if: Your symptoms do not get better. Your symptoms get worse. You have signs of depression, such as: A persistently sad or irritable mood. Loss of enjoyment in activities that used to bring you joy. Change in weight or eating. Changes in sleeping habits. Get help right away if: You have thoughts about hurting yourself or others. If you ever feel like you may hurt yourself or others, or have thoughts about taking your own life, get help right away. Go to your nearest emergency department or: Call your local emergency services (911 in the [place] Call a suicide crisis helpline, such as the National Suicide Prevention Lifeline at 929-232-0903 or 988 in the [place] This is open 24 hours a day in the [place] If you're a Veteran: Call 988 and press 1. This is open 24 hours a day. Text the [employer] at  (201)311-9003. Summary Generalized anxiety disorder (GAD) is a mental health condition that involves worry that is not triggered by a specific event. People with GAD often worry excessively about many things in their lives, such as their health and family. GAD may cause symptoms such as restlessness, trouble concentrating, sleep problems, frequent sweating, nausea, diarrhea, headaches, and trembling or muscle twitching. A mental health specialist can help determine which treatment is best for you. Some people see improvement with one type of therapy. However, other people require a combination of therapies. This information is not intended to replace advice given to you by your health care provider. Make sure you discuss any questions you have with your health care provider. Document Revised: 02/28/2023 Document Reviewed: 11/06/2020 Elsevier Patient Education  2024 Elsevier Inc. Managing Anxiety, Adult After being diagnosed with anxiety, you may be relieved to know why you have felt or behaved a certain way. You may also feel overwhelmed about the treatment ahead and what it will mean for your life. With care and support, you can manage your anxiety. How to manage lifestyle changes Understanding the difference between stress and anxiety Although stress can play a role in anxiety, it is not the same as anxiety. Stress is your body's reaction to life changes and events, both good and bad. Stress is often caused  by something external, such as a deadline, test, or competition. It normally goes away after the event has ended and will last just a few hours. But, stress can be ongoing and can lead to more than just stress. Anxiety is caused by something internal, such as imagining a terrible outcome or worrying that something will go wrong that will greatly upset you. Anxiety often does not go away even after the event is over, and it can become a long-term (chronic) worry. Lowering stress and anxiety Talk with  your health care provider or a counselor to learn more about lowering anxiety and stress. They may suggest tension-reduction techniques, such as: Music. Spend time creating or listening to music that you enjoy and that inspires you. Mindfulness-based meditation. Practice being aware of your normal breaths while not trying to control your breathing. It can be done while sitting or walking. Centering prayer. Focus on a word, phrase, or sacred image that means something to you and brings you peace. Deep breathing. Expand your stomach and inhale slowly through your nose. Hold your breath for 3-5 seconds. Then breathe out slowly, letting your stomach muscles relax. Self-talk. Learn to notice and spot thought patterns that lead to anxiety reactions. Change those patterns to thoughts that feel peaceful. Muscle relaxation. Take time to tense muscles and then relax them. Choose a tension-reduction technique that fits your lifestyle and personality. These techniques take time and practice. Set aside 5-15 minutes a day to do them. Specialized therapists can offer counseling and training in these techniques. The training to help with anxiety may be covered by some insurance plans. Other things you can do to manage stress and anxiety include: Keeping a stress diary. This can help you learn what triggers your reaction and then learn ways to manage your response. Thinking about how you react to certain situations. You may not be able to control everything, but you can control your response. Making time for activities that help you relax and not feeling guilty about spending your time in this way. Doing visual imagery. This involves imagining or creating mental pictures to help you relax. Practicing yoga. Through yoga poses, you can lower tension and relax.   Medicines Medicines for anxiety include: Antidepressant medicines. These are usually prescribed for long-term daily control. Anti-anxiety medicines. These  may be added in severe cases, especially when panic attacks occur. When used together, medicines, psychotherapy, and tension-reduction techniques may be the most effective treatment. Relationships Relationships can play a big part in helping you recover. Spend more time connecting with trusted friends and family members. Think about going to couples counseling if you have a partner, taking family education classes, or going to family therapy. Therapy can help you and others better understand your anxiety. How to recognize changes in your anxiety Everyone responds differently to treatment for anxiety. Recovery from anxiety happens when symptoms lessen and stop interfering with your daily life at home or work. This may mean that you will start to: Have better concentration and focus. Worry will interfere less in your daily thinking. Sleep better. Be less irritable. Have more energy. Have improved memory. Try to recognize when your condition is getting worse. Contact your provider if your symptoms interfere with home or work and you feel like your condition is not improving. Follow these instructions at home: Activity Exercise. Adults should: Exercise for at least 150 minutes each week. The exercise should increase your heart rate and make you sweat (moderate-intensity exercise). Do strengthening exercises at least twice  a week. Get the right amount and quality of sleep. Most adults need 7-9 hours of sleep each night. Lifestyle  Eat a healthy diet that includes plenty of vegetables, fruits, whole grains, low-fat dairy products, and lean protein. Do not eat a lot of foods that are high in fats, added sugars, or salt (sodium). Make choices that simplify your life. Do not use any products that contain nicotine or tobacco. These products include cigarettes, chewing tobacco, and vaping devices, such as e-cigarettes. If you need help quitting, ask your provider. Avoid caffeine, alcohol, and certain  over-the-counter cold medicines. These may make you feel worse. Ask your pharmacist which medicines to avoid. General instructions Take over-the-counter and prescription medicines only as told by your provider. Keep all follow-up visits. This is to make sure you are managing your anxiety well or if you need more support. Where to find support You can get help and support from: Self-help groups. Online and [employer]. A trusted spiritual leader. Couples counseling. Family education classes. Family therapy. Where to find more information You may find that joining a support group helps you deal with your anxiety. The following sources can help you find counselors or support groups near you: Mental Health [place]: mentalhealthamerica.net Anxiety and Depression Association of America (ADAA): adaa.org [employer] on Mental Illness (NAMI): nami.org Contact a health care provider if: You have a hard time staying focused or finishing tasks. You spend many hours a day feeling worried about everyday life. You are very tired because you cannot stop worrying. You start to have headaches or often feel tense. You have chronic nausea or diarrhea. Get help right away if: Your heart feels like it is racing. You have shortness of breath. You have thoughts of hurting yourself or others. Get help right away if you feel like you may hurt yourself or others, or have thoughts about taking your own life. Go to your nearest emergency room or: Call 911. Call the National Suicide Prevention Lifeline at 586 168 3786 or 988. This is open 24 hours a day. Text the Crisis Text Line at 417-872-6499. This information is not intended to replace advice given to you by your health care provider. Make sure you discuss any questions you have with your health care provider. Document Revised: 04/24/2022 Document Reviewed: 11/06/2020 Elsevier Patient Education  2024 Elsevier Inc. GERD in Adults: Diet  Changes When you have gastroesophageal reflux disease (GERD), you may need to make changes to your diet. Choosing the right foods can help with your symptoms. Think about working with an expert in healthy eating called a dietitian. They can help you make healthy food choices. What are tips for following this plan? Reading food labels Look for foods that are low in saturated fat. Foods that may help with your symptoms include: Foods with less than 5% of daily value (DV) of fat. Foods with 0 grams of trans fat. Cooking [employer] in ways that don't use a lot of fat. These ways include: Baking. Steaming. Grilling. Broiling. To add flavor, try to use herbs that are low in spice and acidity. Avoid frying your food. Meal planning  Eat small meals often rather than eating 3 large meals each day. Eat your meals slowly in a place where you feel relaxed. If told by your health care provider, avoid: Foods that cause symptoms. Keep a food diary to keep track of foods that cause symptoms. Alcohol. Drinking a lot of liquid with meals. General instructions For 2-3 hours after you eat,  avoid: Bending over. Exercise. Lying down. Chew sugar-free gum after meals. What foods should I eat? Eat a healthy diet. Try to include: Foods with high amounts of fiber. These include: Fruits and vegetables. Whole grains and beans. Low-fat dairy products. Lean meats, fish, and poultry. Egg whites. Foods that cause symptoms in someone else may not cause symptoms for you. Work with your provider to find foods that are safe for you. The items listed above may not be all the foods and drinks you can have. Talk with a dietitian to learn more. The items listed above may not be a complete list of foods and beverages you can eat and drink. Contact a dietitian for more information. What foods should I avoid? Limiting some of these foods may help with your symptoms. Each person is different. Talk with a dietitian  or your provider to help you find the exact foods to avoid. Some of the foods to avoid may include: Fruits Fruits with a lot of acid in them. These may include citrus fruits, such as oranges, grapefruit, pineapple, and lemons. Vegetables Deep-fried vegetables, such as French fries. Vegetables, sauces, or toppings made with added fat and vegetables with acid in them. These may include tomatoes and tomato products, chili peppers, onions, garlic, and horseradish. Grains Pastries or quick breads with added fat. Meats and other proteins High-fat meats, such as fatty beef or pork, hot dogs, ribs, ham, sausage, salami, and bacon. Fried meat or protein, such as fried fish and fried chicken. Egg yolks. Fats and oils Butter. Margarine. Shortening. Ghee. Drinks Coffee and other drinks with caffeine in them. Fizzy and sugary drinks, such as soda and energy drinks. Fruit juice made with acidic fruits, such as orange or grapefruit. Tomato juice. Sweets and desserts Chocolate and cocoa. Donuts. Seasonings and condiments Mint, such as peppermint and spearmint. Condiments, herbs, or seasonings that cause symptoms. These may include curry, hot sauce, or vinegar-based salad dressings. The items listed above may not be all the foods and drinks you should avoid. Talk with a dietitian to learn more. Questions to ask your health care provider Changes to your diet and everyday life are often the first steps taken to manage symptoms of GERD. If these changes don't help, talk with your provider about taking medicines. Where to find more information International Foundation for Gastrointestinal Disorders: aboutgerd.org This information is not intended to replace advice given to you by your health care provider. Make sure you discuss any questions you have with your health care provider. Document Revised: 05/28/2023 Document Reviewed: 12/12/2022 Elsevier Patient Education  2024 Elsevier Inc. Adjustment Disorder in  Adults: What to Know Adjustment disorder is a mental health condition caused by difficulty coping with or recovering from one stressful event or ongoing circumstances that are stressful. These include difficult situations or big changes in life. Symptoms usually start within three months after a stressful event or after stressful circumstances begin. An adjustment disorder can affect how you feel, think, or act. It can also: Make it hard to go about your daily life. Increase your risk of using or abusing substances. Increase your risk of suicide. If adjustment disorder is not treated, it could make other conditions that you have worse. What are the causes? Adjustment disorder can be caused by many different stressful life events or circumstances, such as: Big life changes, like getting divorced or losing a job. Ongoing problems, like being sick for a long time or having money troubles. Traumatic events, like losing someone you love  or experiencing a natural disaster. Changes that happen as we grow up or get older, like becoming a parent or retiring. What can increase the risk? Environmental factors.People who face many challenges in life may have more stress. Ongoing stress can lead to more emotional strain and make it more difficult to cope with additional stressors. This increases the risk of developing an adjustment disorder. Cultural context. Different cultures view and handle stress in various ways. This can make it hard to tell if someone is reacting too strongly or needs help. Because of this, some cultures may have more people with adjustment disorders that go unnoticed. Personal history. If someone has had mental health problems before, they may have a higher risk for adjustment disorder. For example, if a person has felt very anxious or sad before, they may find it harder to deal with new stress. This can make it more likely for them to develop an adjustment disorder when they face changes  in their life. Social support. Without support from others, a person can find it hard to handle stress, which can lead to trouble coping with stressful situations. This can make it more likely to develop an adjustment disorder. What are the signs or symptoms? Symptoms can be different for everyone. Some examples of symptoms are: Behavioral symptoms, such as: Trouble doing daily tasks. Being impulsive or reckless. Ignoring or not taking care of responsibilities, including skipping work or school. Fighting or being aggressive towards others. Having problems concentrating or focusing. Withdrawing from friends and family. Emotional symptoms, such as: Feeling sad, depressed, or crying a lot. Worrying too much or feeling nervous a lot. No longer able to enjoy activities. Feeling worthless or hopeless. Feeling tense or irritable. Physical symptoms, such as: Change in appetite, such as eating too much or too little. Increased heart rate, sweating, or stomachaches that aren't related to being sick. Trouble sleeping or sleeping too much. Being really tired or having low energy. Being jittery or shaky. How is this diagnosed? To find out if you have an adjustment disorder, a health care provider or mental health professional will: Talk to you. They'll ask about your feelings, what's been happening in your life, and how you're doing overall. Check your symptoms.They'll look for signs and symptoms that are needed for an adjustment disorder diagnosis. For example, in adjustment disorders, symptoms start within 3 months of a stressful event or start of stressful situation, but don't last longer than 6 months after the event or end of the stressful situation. Rule out other problems. The provider will make sure that the symptoms aren't caused by other mental health problems, like depression. Identify the type of adjustment disorder. The provider may describe the type of adjustment disorder based on the  main symptoms, such as sadness or anxiety. How is this treated? The length of treatment is usually short, but it can be longer for people with ongoing or long-lasting stressors. Treatment can include: Talk therapy. Medicine. Using both talk therapy and medicine. Where to find support Your provider. A loved one, family, or trusted friend. Contact the [facility] HelpLine. Available Monday- Friday, 10 a.m. - 10 p.m. ET. Call 1-800-950-NAMI 7153540154). Text HelpLine to 818-702-7121. Email: helpline@nami .org. Where to find more information For information about living with mental health conditions visit: metrobloggers.si. Contact a health care provider if: Your symptoms are getting worse. You start to have symptoms you haven't had before, and the symptoms worry you. Get help right away if: You feel like you may hurt yourself or  others. You have thoughts about taking your own life. You have other thoughts or feelings that worry you. These symptoms may be an emergency. Take one of these steps right away: Go to your nearest emergency room. Call 911. Contact The 988 Suicide & Crisis Lifeline (24 hours a day, 7 days a week, free and confidential): Call or text 988. Chat online at chat.988lifeline.org For The [employer] and their loved ones, contact the [employer]: Call 988 and press 1. Text 650-632-7661. Chat online at veteranscrisisline.net This information is not intended to replace advice given to you by your health care provider. Make sure you discuss any questions you have with your health care provider. Document Revised: 08/08/2023 Document Reviewed: 08/08/2023 Elsevier Patient Education  2025 [employer]. Hypothyroidism  Hypothyroidism is when the thyroid  gland does not make enough of certain hormones. This is called an underactive thyroid . The thyroid  gland is a small gland located in the lower front part of the neck, just in front of the windpipe (trachea). This gland makes hormones that help  control how the body uses food for energy (metabolism) as well as how the heart and brain function. These hormones also play a role in keeping your bones strong. When the thyroid  is underactive, it produces too little of the hormones thyroxine (T4) and triiodothyronine (T3). What are the causes? This condition may be caused by: Hashimoto's disease. This is a disease in which the body's disease-fighting system (immune system) attacks the thyroid  gland. This is the most common cause. Viral infections. Pregnancy. Certain medicines. Birth defects. Problems with a gland in the center of the brain (pituitary gland). Lack of enough iodine in the diet. Other causes may include: Past radiation treatments to the head or neck for cancer. Past treatment with radioactive iodine. Past exposure to radiation in the environment. Past surgical removal of part or all of the thyroid . What increases the risk? You are more likely to develop this condition if: You are female. You have a family history of thyroid  conditions. You use a medicine called lithium. You take medicines that affect the immune system (immunosuppressants). What are the signs or symptoms? Common symptoms of this condition include: Not being able to tolerate cold. Feeling as though you have no energy (lethargy). Lack of appetite. Constipation. Sadness or depression. Weight gain that is not explained by a change in diet or exercise habits. Menstrual irregularity. Dry skin, coarse hair, or brittle nails. Other symptoms may include: Muscle pain. Slowing of thought processes. Poor memory. How is this diagnosed? This condition may be diagnosed based on: Your symptoms, your medical history, and a physical exam. Blood tests. You may also have imaging tests, such as an ultrasound or MRI. How is this treated? This condition is treated with medicine that replaces the thyroid  hormones that your body does not make. After you begin  treatment, it may take several weeks for symptoms to go away. Follow these instructions at home: Take over-the-counter and prescription medicines only as told by your health care provider. If you start taking any new medicines, tell your health care provider. Keep all follow-up visits as told by your health care provider. This is important. As your condition improves, your dosage of thyroid  hormone medicine may change. You will need to have blood tests regularly so that your health care provider can monitor your condition. Contact a health care provider if: Your symptoms do not get better with treatment. You are taking thyroid  hormone replacement medicine and you: Sweat a lot. Have  tremors. Feel anxious. Lose weight rapidly. Cannot tolerate heat. Have emotional swings. Have diarrhea. Feel weak. Get help right away if: You have chest pain. You have an irregular heartbeat. You have a rapid heartbeat. You have difficulty breathing. These symptoms may be an emergency. Get help right away. Call 911. Do not wait to see if the symptoms will go away. Do not drive yourself to the hospital. Summary Hypothyroidism is when the thyroid  gland does not make enough of certain hormones (it is underactive). When the thyroid  is underactive, it produces too little of the hormones thyroxine (T4) and triiodothyronine (T3). The most common cause is Hashimoto's disease, a disease in which the body's disease-fighting system (immune system) attacks the thyroid  gland. The condition can also be caused by viral infections, medicine, pregnancy, or past radiation treatment to the head or neck. Symptoms may include weight gain, dry skin, constipation, feeling as though you do not have energy, and not being able to tolerate cold. This condition is treated with medicine to replace the thyroid  hormones that your body does not make. This information is not intended to replace advice given to you by your health care  provider. Make sure you discuss any questions you have with your health care provider. Document Revised: 07/18/2021 Document Reviewed: 07/18/2021 Elsevier Patient Education  2024 [employer] Inc. Managing Depression, Adult Depression is a mental health condition that affects your thoughts, feelings, and actions. Being diagnosed with depression can bring you relief if you did not know why you have felt or behaved a certain way. It could also leave you feeling overwhelmed. Finding ways to manage your symptoms can help you feel more positive about your future. How to manage lifestyle changes Being depressed is difficult. Depression can increase the level of everyday stress. Stress can make depression symptoms worse. You may believe your symptoms cannot be managed or will never improve. However, there are many things you can try to help manage your symptoms. There is hope. Managing stress  Stress is your body's reaction to life changes and events, both good and bad. Stress can add to your feelings of depression. Learning to manage your stress can help lessen your feelings of depression. Try some of the following approaches to reducing your stress (stress reduction techniques): Listen to music that you enjoy and that inspires you. Try using a meditation app or take a meditation class. Develop a practice that helps you connect with your spiritual self. Walk in nature, pray, or go to a place of worship. Practice deep breathing. To do this, inhale slowly through your nose. Pause at the top of your inhale for a few seconds and then exhale slowly, letting yourself relax. Repeat this three or four times. Practice yoga to help relax and work your muscles. Choose a stress reduction technique that works for you. These techniques take time and practice to develop. Set aside 5-15 minutes a day to do them. Therapists can offer training in these techniques. Do these things to help manage stress: Keep a journal. Know  your limits. Set healthy boundaries for yourself and others, such as saying no when you think something is too much. Pay attention to how you react to certain situations. You may not be able to control everything, but you can change your reaction. Add humor to your life by watching funny movies or shows. Make time for activities that you enjoy and that relax you. Spend less time using electronics, especially at night before bed. The light from screens can make  your brain think it is time to get up rather than go to bed.   Medicines Medicines, such as antidepressants, are often a part of treatment for depression. Talk with your pharmacist or health care provider about all the medicines, supplements, and herbal products that you take, their possible side effects, and what medicines and other products are safe to take together. Make sure to report any side effects you may have to your health care provider. Relationships Your health care provider may suggest family therapy, couples therapy, or individual therapy as part of your treatment. How to recognize changes Everyone responds differently to treatment for depression. As you recover from depression, you may start to: Have more interest in doing activities. Feel more hopeful. Have more energy. Eat a more regular amount of food. Have better mental focus. It is important to recognize if your depression is not getting better or is getting worse. The symptoms you had in the beginning may return, such as: Feeling tired. Eating too much or too little. Sleeping too much or too little. Feeling restless, agitated, or hopeless. Trouble focusing or making decisions. Having unexplained aches and pains. Feeling irritable, angry, or aggressive. If you or your family members notice these symptoms coming back, let your health care provider know right away. Follow these instructions at home: Activity Try to get some form of exercise each day, such as  walking. Try yoga, mindfulness, or other stress reduction techniques. Participate in group activities if you are able. Lifestyle Get enough sleep. Cut down on or stop using caffeine, tobacco, alcohol, and any other harmful substances. Eat a healthy diet that includes plenty of vegetables, fruits, whole grains, low-fat dairy products, and lean protein. Limit foods that are high in solid fats, added sugar, or salt (sodium). General instructions Take over-the-counter and prescription medicines only as told by your health care provider. Keep all follow-up visits. It is important for your health care provider to check on your mood, behavior, and medicines. Your health care provider may need to make changes to your treatment. Where to find support Talking to others  Friends and family members can be sources of support and guidance. Talk to trusted friends or family members about your condition. Explain your symptoms and let them know that you are working with a health care provider to treat your depression. Tell friends and family how they can help. Finances Find mental health providers that fit with your financial situation. Talk with your health care provider if you are worried about access to food, housing, or medicine. Call your insurance company to learn about your co-pays and prescription plan. Where to find more information You can find support in your area from: Anxiety and Depression Association of America (ADAA): adaa.org Mental Health America: mentalhealthamerica.net [employer] on Mental Illness: nami.org Contact a health care provider if: You stop taking your antidepressant medicines, and you have any of these symptoms: Nausea. Headache. Light-headedness. Chills and body aches. Not being able to sleep (insomnia). You or your friends and family think your depression is getting worse. Get help right away if: You have thoughts of hurting yourself or others. Get help right  away if you feel like you may hurt yourself or others, or have thoughts about taking your own life. Go to your nearest emergency room or: Call 911. Call the National Suicide Prevention Lifeline at 3370426815 or 988. This is open 24 hours a day. Text the Crisis Text Line at 623-025-4669. This information is not intended to replace advice  given to you by your health care provider. Make sure you discuss any questions you have with your health care provider. Document Revised: 11/21/2021 Document Reviewed: 11/21/2021 Elsevier Patient Education  2024 Elsevier Inc.  The above assessment and management plan was discussed with the patient. The patient verbalized understanding of and has agreed to the management plan. Patient is aware to call the clinic if they develop any new symptoms or if symptoms persist or worsen. Patient is aware when to return to the clinic for a follow-up visit. Patient educated on when it is appropriate to go to the emergency department.   Kare Dado [place] Thompson, DNP [facility] [address] [place], [place] [address] [phone]

## 2024-07-06 ENCOUNTER — Ambulatory Visit: Admitting: Nurse Practitioner

## 2024-07-06 VITALS — BP 107/68 | HR 74 | Temp 98.7°F | Ht 62.5 in | Wt 213.2 lb

## 2024-07-06 DIAGNOSIS — J309 Allergic rhinitis, unspecified: Secondary | ICD-10-CM | POA: Insufficient documentation

## 2024-07-06 DIAGNOSIS — F330 Major depressive disorder, recurrent, mild: Secondary | ICD-10-CM | POA: Insufficient documentation

## 2024-07-06 DIAGNOSIS — E6609 Other obesity due to excess calories: Secondary | ICD-10-CM | POA: Insufficient documentation

## 2024-07-06 DIAGNOSIS — F411 Generalized anxiety disorder: Secondary | ICD-10-CM | POA: Insufficient documentation

## 2024-07-06 DIAGNOSIS — Z8249 Family history of ischemic heart disease and other diseases of the circulatory system: Secondary | ICD-10-CM | POA: Insufficient documentation

## 2024-07-06 DIAGNOSIS — Z0001 Encounter for general adult medical examination with abnormal findings: Secondary | ICD-10-CM | POA: Insufficient documentation

## 2024-07-06 DIAGNOSIS — G4709 Other insomnia: Secondary | ICD-10-CM | POA: Insufficient documentation

## 2024-07-06 DIAGNOSIS — F4321 Adjustment disorder with depressed mood: Secondary | ICD-10-CM | POA: Insufficient documentation

## 2024-07-06 DIAGNOSIS — A1801 Tuberculosis of spine: Secondary | ICD-10-CM

## 2024-07-06 DIAGNOSIS — E039 Hypothyroidism, unspecified: Secondary | ICD-10-CM | POA: Insufficient documentation

## 2024-07-06 DIAGNOSIS — Z803 Family history of malignant neoplasm of breast: Secondary | ICD-10-CM | POA: Insufficient documentation

## 2024-07-06 DIAGNOSIS — K219 Gastro-esophageal reflux disease without esophagitis: Secondary | ICD-10-CM | POA: Insufficient documentation

## 2024-07-06 LAB — BAYER DCA HB A1C WAIVED: HB A1C (BAYER DCA - WAIVED): 4.7 % — ABNORMAL LOW (ref 4.8–5.6)

## 2024-07-06 MED ORDER — CETIRIZINE HCL 10 MG PO TABS: 10.0000 mg | ORAL_TABLET | Freq: Every day | ORAL | 1 refills | Status: AC

## 2024-07-06 MED ORDER — PANTOPRAZOLE SODIUM 40 MG PO TBEC: 40.0000 mg | DELAYED_RELEASE_TABLET | Freq: Every day | ORAL | 1 refills | Status: AC

## 2024-07-06 MED ORDER — HYDROXYZINE HCL 25 MG PO TABS: 25.0000 mg | ORAL_TABLET | Freq: Every day | ORAL | 1 refills | Status: AC

## 2024-07-06 MED ORDER — HYDROXYZINE HCL 25 MG PO TABS: 25.0000 mg | ORAL_TABLET | Freq: Three times a day (TID) | ORAL | 0 refills | Status: DC | PRN

## 2024-07-06 MED ORDER — EPINEPHRINE 0.3 MG/0.3ML IJ SOAJ: 0.3000 mg | INTRAMUSCULAR | 1 refills | Status: AC | PRN

## 2024-07-07 ENCOUNTER — Other Ambulatory Visit: Payer: Self-pay | Admitting: Nurse Practitioner

## 2024-07-07 ENCOUNTER — Ambulatory Visit: Payer: Self-pay | Admitting: Nurse Practitioner

## 2024-07-07 ENCOUNTER — Telehealth: Payer: Self-pay

## 2024-07-07 LAB — LIPID PANEL
Chol/HDL Ratio: 3.1 ratio (ref 0.0–4.4)
Cholesterol, Total: 166 mg/dL (ref 100–199)
HDL: 54 mg/dL (ref >39)
LDL Chol Calc (NIH): 100 mg/dL — ABNORMAL HIGH (ref 0–99)
Triglycerides: 58 mg/dL (ref 0–149)
VLDL Cholesterol Cal: 12 mg/dL (ref 5–40)

## 2024-07-07 LAB — HEPB+HEPC+HIV PANEL
HIV Screen 4th Generation wRfx: NONREACTIVE
Hep B E Ab: NONREACTIVE
Hep B Surface Ab, Qual: REACTIVE
Hep C Virus Ab: NONREACTIVE

## 2024-07-07 LAB — CBC WITH DIFFERENTIAL/PLATELET
Basophils Absolute: 0 x10E3/uL (ref 0.0–0.2)
Basos: 1 %
EOS (ABSOLUTE): 0.2 x10E3/uL (ref 0.0–0.4)
Eos: 3 %
Hematocrit: 42.7 % (ref 34.0–46.6)
Hemoglobin: 14.2 g/dL (ref 11.1–15.9)
Immature Grans (Abs): 0 x10E3/uL (ref 0.0–0.1)
Immature Granulocytes: 0 %
Lymphocytes Absolute: 1.6 x10E3/uL (ref 0.7–3.1)
Lymphs: 22 %
MCH: 29.8 pg (ref 26.6–33.0)
MCHC: 33.3 g/dL (ref 31.5–35.7)
MCV: 90 fL (ref 79–97)
Monocytes Absolute: 0.4 x10E3/uL (ref 0.1–0.9)
Monocytes: 6 %
Neutrophils Absolute: 4.9 x10E3/uL (ref 1.4–7.0)
Neutrophils: 68 %
Platelets: 313 x10E3/uL (ref 150–450)
RBC: 4.76 x10E6/uL (ref 3.77–5.28)
RDW: 12 % (ref 11.7–15.4)
WBC: 7.1 x10E3/uL (ref 3.4–10.8)

## 2024-07-07 LAB — CMP14+EGFR
ALT: 15 IU/L (ref 0–32)
AST: 21 IU/L (ref 0–40)
Albumin: 4.4 g/dL (ref 3.9–4.9)
Alkaline Phosphatase: 82 IU/L (ref 41–116)
BUN/Creatinine Ratio: 21 (ref 9–23)
BUN: 16 mg/dL (ref 6–20)
Bilirubin Total: 0.4 mg/dL (ref 0.0–1.2)
CO2: 24 mmol/L (ref 20–29)
Calcium: 9.3 mg/dL (ref 8.7–10.2)
Chloride: 104 mmol/L (ref 96–106)
Creatinine, Ser: 0.76 mg/dL (ref 0.57–1.00)
Globulin, Total: 2.7 g/dL (ref 1.5–4.5)
Glucose: 89 mg/dL (ref 70–99)
Potassium: 4 mmol/L (ref 3.5–5.2)
Sodium: 140 mmol/L (ref 134–144)
Total Protein: 7.1 g/dL (ref 6.0–8.5)
eGFR: 104 mL/min/1.73 (ref >59)

## 2024-07-07 LAB — THYROID PANEL WITH TSH
Free Thyroxine Index: 2.7 (ref 1.2–4.9)
T3 Uptake Ratio: 28 % (ref 24–39)
T4, Total: 9.7 ug/dL (ref 4.5–12.0)
TSH: 3.18 u[IU]/mL (ref 0.450–4.500)

## 2024-07-07 MED ORDER — LEVOTHYROXINE SODIUM 75 MCG PO TABS: 75.0000 ug | ORAL_TABLET | Freq: Every day | ORAL | 1 refills | Status: DC

## 2024-07-07 NOTE — Telephone Encounter (Signed)
 Copied from CRM [id number]. Topic: General - Call Back - No Documentation >> Jul 07, 2024 11:31 AM Tobias CROME wrote: Reason for CRM: Patient returning call to office. Requesting callback: [phone]

## 2024-07-07 NOTE — Telephone Encounter (Signed)
 Reviewed results with patient and she voiced understanding. Requesting that PCP send in her thyroid  Rx to [facility] pharmacy.

## 2024-07-07 NOTE — Telephone Encounter (Signed)
 Patient aware and voiced understanding

## 2024-07-08 ENCOUNTER — Telehealth: Payer: Self-pay

## 2024-07-08 ENCOUNTER — Other Ambulatory Visit: Payer: Self-pay | Admitting: *Deleted

## 2024-07-08 ENCOUNTER — Other Ambulatory Visit: Payer: Self-pay | Admitting: Nurse Practitioner

## 2024-07-08 DIAGNOSIS — E039 Hypothyroidism, unspecified: Secondary | ICD-10-CM

## 2024-07-08 MED ORDER — SYNTHROID 75 MCG PO TABS: 75.0000 ug | ORAL_TABLET | Freq: Every day | ORAL | 0 refills | Status: DC

## 2024-07-08 MED ORDER — LEVOTHYROXINE SODIUM 75 MCG PO TABS: 75.0000 ug | ORAL_TABLET | Freq: Every day | ORAL | 0 refills | Status: DC

## 2024-07-08 MED ORDER — SYNTHROID 75 MCG PO TABS: 75.0000 ug | ORAL_TABLET | Freq: Every day | ORAL | 0 refills | Status: AC

## 2024-07-08 NOTE — Telephone Encounter (Signed)
 Copied from CRM #[id number]. Topic: Clinical - Medication Question >> Jul 08, 2024  1:37 PM Larissa S wrote: Reason for CRM: Patient states she needs a prescription for Synthroid , brand name, and not the generic sent to her pharmacy so that medication is covered by her insurance. She states it must be brand name and states dispensed as written. She is requesting a callback once completed.   [facility] [place], [place] - [address] [place] [address] Phone: [phone] Fax: [phone] Hours: Not open 24 hours

## 2024-07-08 NOTE — Addendum Note (Signed)
 Addended by: JODENE CORNERS B on: 07/08/2024 05:14 PM   Modules accepted: Orders

## 2024-07-08 NOTE — Telephone Encounter (Signed)
 Fax from [facility]: Rx for Levothyroxine  75 mcg Note from pharmacy: pt would like BRAND Synthroid  Please advise and send in new script

## 2024-07-08 NOTE — Addendum Note (Signed)
 Addended by: JODENE CORNERS B on: 07/08/2024 05:10 PM   Modules accepted: Orders

## 2024-08-24 ENCOUNTER — Encounter: Payer: Self-pay | Admitting: *Deleted

## 2025-01-01 ENCOUNTER — Ambulatory Visit: Admitting: Family Medicine

## 2025-01-04 ENCOUNTER — Ambulatory Visit: Admitting: Nurse Practitioner
